# Patient Record
Sex: Female | Born: 1980 | Race: White | Hispanic: No | Marital: Single | Smoking: Current some day smoker
Health system: Southern US, Community
[De-identification: ages and names within clinical notes are randomized; demographics above are authoritative.]

## PROBLEM LIST (undated history)

## (undated) DIAGNOSIS — M545 Low back pain, unspecified: Secondary | ICD-10-CM

## (undated) DIAGNOSIS — G40409 Other generalized epilepsy and epileptic syndromes, not intractable, without status epilepticus: Secondary | ICD-10-CM

## (undated) DIAGNOSIS — F32A Depression, unspecified: Secondary | ICD-10-CM

## (undated) DIAGNOSIS — R51 Headache: Secondary | ICD-10-CM

## (undated) DIAGNOSIS — G8929 Other chronic pain: Secondary | ICD-10-CM

## (undated) DIAGNOSIS — F419 Anxiety disorder, unspecified: Secondary | ICD-10-CM

## (undated) DIAGNOSIS — F329 Major depressive disorder, single episode, unspecified: Secondary | ICD-10-CM

## (undated) DIAGNOSIS — G43909 Migraine, unspecified, not intractable, without status migrainosus: Secondary | ICD-10-CM

## (undated) DIAGNOSIS — R519 Headache, unspecified: Secondary | ICD-10-CM

## (undated) DIAGNOSIS — K219 Gastro-esophageal reflux disease without esophagitis: Secondary | ICD-10-CM

## (undated) DIAGNOSIS — K259 Gastric ulcer, unspecified as acute or chronic, without hemorrhage or perforation: Secondary | ICD-10-CM

---

## 2001-08-19 ENCOUNTER — Emergency Department (HOSPITAL_COMMUNITY): Admission: EM | Admit: 2001-08-19 | Discharge: 2001-08-19 | Payer: Self-pay | Admitting: Emergency Medicine

## 2003-06-20 ENCOUNTER — Emergency Department (HOSPITAL_COMMUNITY): Admission: EM | Admit: 2003-06-20 | Discharge: 2003-06-21 | Payer: Self-pay | Admitting: Emergency Medicine

## 2003-06-21 ENCOUNTER — Encounter: Payer: Self-pay | Admitting: Emergency Medicine

## 2003-10-08 ENCOUNTER — Ambulatory Visit (HOSPITAL_COMMUNITY): Admission: RE | Admit: 2003-10-08 | Discharge: 2003-10-08 | Payer: Self-pay | Admitting: Family Medicine

## 2003-10-18 HISTORY — PX: LAPAROSCOPIC CHOLECYSTECTOMY: SUR755

## 2003-11-11 ENCOUNTER — Other Ambulatory Visit: Admission: RE | Admit: 2003-11-11 | Discharge: 2003-11-11 | Payer: Self-pay | Admitting: Family Medicine

## 2003-11-11 ENCOUNTER — Other Ambulatory Visit: Admission: RE | Admit: 2003-11-11 | Discharge: 2003-11-11 | Payer: Self-pay | Admitting: *Deleted

## 2003-12-17 ENCOUNTER — Ambulatory Visit (HOSPITAL_COMMUNITY): Admission: RE | Admit: 2003-12-17 | Discharge: 2003-12-17 | Payer: Self-pay | Admitting: Family Medicine

## 2004-09-26 ENCOUNTER — Inpatient Hospital Stay (HOSPITAL_COMMUNITY): Admission: EM | Admit: 2004-09-26 | Discharge: 2004-09-28 | Payer: Self-pay | Admitting: Emergency Medicine

## 2004-09-27 ENCOUNTER — Encounter (INDEPENDENT_AMBULATORY_CARE_PROVIDER_SITE_OTHER): Payer: Self-pay | Admitting: *Deleted

## 2005-01-07 ENCOUNTER — Encounter: Admission: RE | Admit: 2005-01-07 | Discharge: 2005-01-07 | Payer: Self-pay | Admitting: General Surgery

## 2005-06-14 ENCOUNTER — Encounter: Admission: RE | Admit: 2005-06-14 | Discharge: 2005-06-14 | Payer: Self-pay | Admitting: General Surgery

## 2005-06-27 ENCOUNTER — Emergency Department (HOSPITAL_COMMUNITY): Admission: EM | Admit: 2005-06-27 | Discharge: 2005-06-27 | Payer: Self-pay | Admitting: Emergency Medicine

## 2005-07-14 ENCOUNTER — Other Ambulatory Visit: Admission: RE | Admit: 2005-07-14 | Discharge: 2005-07-14 | Payer: Self-pay | Admitting: Family Medicine

## 2005-08-10 ENCOUNTER — Ambulatory Visit (HOSPITAL_COMMUNITY): Admission: RE | Admit: 2005-08-10 | Discharge: 2005-08-10 | Payer: Self-pay | Admitting: Gastroenterology

## 2005-12-03 ENCOUNTER — Emergency Department (HOSPITAL_COMMUNITY): Admission: EM | Admit: 2005-12-03 | Discharge: 2005-12-03 | Payer: Self-pay | Admitting: Emergency Medicine

## 2006-01-07 ENCOUNTER — Emergency Department (HOSPITAL_COMMUNITY): Admission: EM | Admit: 2006-01-07 | Discharge: 2006-01-08 | Payer: Self-pay | Admitting: Emergency Medicine

## 2006-01-07 ENCOUNTER — Emergency Department (HOSPITAL_COMMUNITY): Admission: EM | Admit: 2006-01-07 | Discharge: 2006-01-07 | Payer: Self-pay | Admitting: Emergency Medicine

## 2006-01-11 ENCOUNTER — Ambulatory Visit (HOSPITAL_BASED_OUTPATIENT_CLINIC_OR_DEPARTMENT_OTHER): Admission: RE | Admit: 2006-01-11 | Discharge: 2006-01-12 | Payer: Self-pay | Admitting: *Deleted

## 2006-03-01 ENCOUNTER — Emergency Department (HOSPITAL_COMMUNITY): Admission: EM | Admit: 2006-03-01 | Discharge: 2006-03-01 | Payer: Self-pay | Admitting: Emergency Medicine

## 2006-06-08 ENCOUNTER — Ambulatory Visit (HOSPITAL_COMMUNITY): Admission: RE | Admit: 2006-06-08 | Discharge: 2006-06-08 | Payer: Self-pay | Admitting: Gastroenterology

## 2006-06-21 ENCOUNTER — Emergency Department (HOSPITAL_COMMUNITY): Admission: EM | Admit: 2006-06-21 | Discharge: 2006-06-21 | Payer: Self-pay | Admitting: Emergency Medicine

## 2006-09-12 ENCOUNTER — Emergency Department (HOSPITAL_COMMUNITY): Admission: EM | Admit: 2006-09-12 | Discharge: 2006-09-12 | Payer: Self-pay | Admitting: Emergency Medicine

## 2006-09-14 ENCOUNTER — Emergency Department (HOSPITAL_COMMUNITY): Admission: EM | Admit: 2006-09-14 | Discharge: 2006-09-15 | Payer: Self-pay | Admitting: Emergency Medicine

## 2007-02-14 ENCOUNTER — Emergency Department (HOSPITAL_COMMUNITY): Admission: EM | Admit: 2007-02-14 | Discharge: 2007-02-14 | Payer: Self-pay | Admitting: Emergency Medicine

## 2007-03-29 ENCOUNTER — Emergency Department: Payer: Self-pay | Admitting: Emergency Medicine

## 2007-06-19 ENCOUNTER — Emergency Department: Payer: Self-pay | Admitting: Emergency Medicine

## 2007-07-28 ENCOUNTER — Emergency Department (HOSPITAL_COMMUNITY): Admission: EM | Admit: 2007-07-28 | Discharge: 2007-07-28 | Payer: Self-pay | Admitting: Emergency Medicine

## 2007-10-14 ENCOUNTER — Emergency Department (HOSPITAL_COMMUNITY): Admission: EM | Admit: 2007-10-14 | Discharge: 2007-10-14 | Payer: Self-pay | Admitting: Emergency Medicine

## 2007-10-18 HISTORY — PX: LACERATION REPAIR: SHX5168

## 2008-08-26 ENCOUNTER — Emergency Department (HOSPITAL_COMMUNITY): Admission: EM | Admit: 2008-08-26 | Discharge: 2008-08-26 | Payer: Self-pay | Admitting: Emergency Medicine

## 2008-09-30 ENCOUNTER — Emergency Department (HOSPITAL_COMMUNITY): Admission: EM | Admit: 2008-09-30 | Discharge: 2008-09-30 | Payer: Self-pay | Admitting: Emergency Medicine

## 2009-10-17 HISTORY — PX: TUBAL LIGATION: SHX77

## 2009-12-22 ENCOUNTER — Inpatient Hospital Stay (HOSPITAL_COMMUNITY): Admission: RE | Admit: 2009-12-22 | Discharge: 2009-12-25 | Payer: Self-pay | Admitting: Obstetrics and Gynecology

## 2009-12-22 ENCOUNTER — Encounter (INDEPENDENT_AMBULATORY_CARE_PROVIDER_SITE_OTHER): Payer: Self-pay | Admitting: Obstetrics and Gynecology

## 2010-11-06 ENCOUNTER — Encounter: Payer: Self-pay | Admitting: Gastroenterology

## 2010-11-07 ENCOUNTER — Encounter: Payer: Self-pay | Admitting: Gastroenterology

## 2010-11-07 ENCOUNTER — Encounter: Payer: Self-pay | Admitting: General Surgery

## 2011-01-10 LAB — CBC
HCT: 25.2 % — ABNORMAL LOW (ref 36.0–46.0)
HCT: 27.9 % — ABNORMAL LOW (ref 36.0–46.0)
Hemoglobin: 8 g/dL — ABNORMAL LOW (ref 12.0–15.0)
Hemoglobin: 8.8 g/dL — ABNORMAL LOW (ref 12.0–15.0)
MCHC: 31.5 g/dL (ref 30.0–36.0)
MCHC: 31.8 g/dL (ref 30.0–36.0)
MCV: 72.9 fL — ABNORMAL LOW (ref 78.0–100.0)
MCV: 73.2 fL — ABNORMAL LOW (ref 78.0–100.0)
Platelets: 348 10*3/uL (ref 150–400)
Platelets: 422 10*3/uL — ABNORMAL HIGH (ref 150–400)
RBC: 3.46 MIL/uL — ABNORMAL LOW (ref 3.87–5.11)
RBC: 3.81 MIL/uL — ABNORMAL LOW (ref 3.87–5.11)
RDW: 19.2 % — ABNORMAL HIGH (ref 11.5–15.5)
RDW: 19.3 % — ABNORMAL HIGH (ref 11.5–15.5)
WBC: 13.7 10*3/uL — ABNORMAL HIGH (ref 4.0–10.5)
WBC: 15.3 10*3/uL — ABNORMAL HIGH (ref 4.0–10.5)

## 2011-01-10 LAB — RAPID URINE DRUG SCREEN, HOSP PERFORMED
Amphetamines: NOT DETECTED
Barbiturates: NOT DETECTED
Benzodiazepines: POSITIVE — AB
Cocaine: POSITIVE — AB
Opiates: POSITIVE — AB
Tetrahydrocannabinol: NOT DETECTED

## 2011-01-10 LAB — RH IMMUNE GLOB WKUP(>/=20WKS)(NOT WOMEN'S HOSP): Fetal Screen: NEGATIVE

## 2011-01-10 LAB — RPR: RPR Ser Ql: NONREACTIVE

## 2011-03-04 NOTE — Op Note (Signed)
[name], [name]              ACCOUNT NO.:  [id number]   MEDICAL RECORD NO.:  [id number]          PATIENT TYPE:  INP   LOCATION:  5738                         FACILITY:  [facility]   PHYSICIAN:  Gabrielle Dare. Janee Morn, M.D.DATE OF BIRTH:  06-20-1981   DATE OF PROCEDURE:  09/27/2004  DATE OF DISCHARGE:                                 OPERATIVE REPORT   PREOPERATIVE DIAGNOSIS:  Acute cholecystitis   POSTOPERATIVE DIAGNOSIS:  Acute cholecystitis   OPERATION/PROCEDURE:  Laparoscopic cholecystectomy with intraoperative  cholangiogram.   SURGEON:  Gabrielle Dare. Janee Morn, M.D.   ASSISTANT:  Johny Shears, R.N.F.A.   ANESTHESIA:  General.   HISTORY OF PRESENT ILLNESS:  The patient is a 30 year old white who is four  months postpartum and has had episodic right upper quadrant pain since the  end of her pregnancy.  Work-up in the emergency department was consistent  with acute cholecystitis.  Liver function tests were normal.  She was  brought to the operating room for cholecystectomy.   DESCRIPTION OF PROCEDURE:  Informed consent had been obtained.  The patient  is receiving intravenous antibiotics.  She was taken to the operating room  and general anesthesia was administered.  Her abdomen was prepped and draped  in the sterile fashion.   An infraumbilical incision was made.  Subcutaneous tissues were dissected  down revealing the anterior fascia which was divided sharply and the  peritoneal cavity was entered under direct vision without difficulty.  A 0  Vicryl pursestring suture was placed around the fascial opening and the  Hasson trocar was inserted into the abdomen.  The abdomen was insufflated  with carbon dioxide in standard fashion.  Under direct vision an 11 mm  epigastric port and two 5 mm lateral ports were placed.  Marcaine 0.25% with  epinephrine was used at all port sites.  The dome of the gallbladder was  then retracted superiorly and medially.  Several filmy adhesions of  omentum  were dissected off the gallbladder and the duodenum was swept away from the  cystic duct area.  There was evidence of chronic inflammation.  This was  done bluntly without any injury to the duodenum.  Once this was freed up,  the dissection was begun laterally and progressed medially.  The cystic duct  was dissected at the infundibulum and cystic duct junction.  This dissection  continued until a large window was made between the cystic duct, the  infundibulum of the gallbladder and the liver. Once this was accomplished  with excellent visualization, a clip was placed on the infundibulocystic  duct junction.  A small nick was made in the cystic duct and a Reddick  cholangiogram catheter was inserted.  Intraoperative cholangiogram was  obtained which demonstrated no common bile duct filling defects.  The  cholangiogram catheter was removed and three clips were proximally on the  cystic duct.  Further dissection revealed the cystic artery which was  clipped twice proximally and once distally and divided.  The gallbladder was  then further dissected and taken off the liver bed with Bovie cautery.  We  encountered a moderate size  posterior branch of the cystic artery.  This was  clipped twice, once distally and divided.  Further cauterization was used to  remove the gallbladder from the liver bed.  One small lymphatic or vein was  also clipped on the gallbladder side.  The gallbladder was taken off the  liver bed.  The liver bed was cauterized to get excellent hemostasis.  The  gallbladder was placed in an EndoCatch bag and taken out of abdomen via the  infraumbilical port site.  The liver bed was rechecked and excellent  hemostasis was present.  The abdomen was copiously irrigated.  The  irrigation returned clear.  The ports were removed under direct vision.  The  pneumoperitoneum was released.  The Hasson trocar was removed from the  abdomen.  The infraumbilical fascia was closed by  tying the 0 Vicryl  pursestring suture.  All four wounds were copiously irrigated.  Some  additional local anesthetic was injected and the skin of each was closed  with a running 4-0 Vicryl subcuticular stitch.  Sponge, needle and  instrument counts were correct.  Benzoin and Steri-Strips and sterile  dressings were applied.  The patient tolerated the procedure well without  apparent complications and was taken to the recovery room in stable  condition.       BET/MEDQ  D:  09/27/2004  T:  09/27/2004  Job:  [id number]

## 2011-03-04 NOTE — H&P (Signed)
[name], [name]              ACCOUNT NO.:  [id number]   MEDICAL RECORD NO.:  [id number]          PATIENT TYPE:  INP   LOCATION:  5738                         FACILITY:  [facility]   PHYSICIAN:  [name], M.D.DATE OF BIRTH:  04/26/1981   DATE OF ADMISSION:  09/26/2004  DATE OF DISCHARGE:                                HISTORY & PHYSICAL   CHIEF COMPLAINT:  Right upper quadrant abdominal pain.   HISTORY OF PRESENT ILLNESS:  The patient is an otherwise healthy 30 year old  white female who is four months post partum, who presented complaining to  the emergency department of acute onset of right upper quadrant abdominal  pain.  She has had several attacks of this pain and she has seen her primary  physician for evaluation.  He had suspected some gallbladder trouble but she  has yet to have a referral to surgery for evaluation.  This pain remains  localized in the right upper quadrant and evaluation in the emergency  department included laboratory studies which were within normal limits but  an ultrasound of her gallbladder was consistent with acute cholecystitis and  cholelithiasis.  She continues to have some pain and nausea despite pain  medication in the emergency room.   PAST MEDICAL HISTORY:  Ulcer disease as a child when she was 66 years  old.  Otherwise negative.   PAST SURGICAL HISTORY:  None.   CURRENT MEDICATIONS:  Birth control pills.   ALLERGIES:  No known drug allergies.   REVIEW OF SYSTEMS:  Constitutional:  Negative.  Cardiac:  Negative.  Pulmonary:  Negative.  GI:  See history of present illness though she does  note some occasional recent blood and streaking in her stools.  Musculoskeletal:  Negative.  GU:  Negative.   PHYSICAL EXAMINATION:  VITAL SIGNS:  She is afebrile.  GENERAL:  She is awake, alert, in no distress.  HEENT:  Pupils equal and reactive.  Sclerae nonicteric.  NECK:  Supple with no masses.  LUNGS:  Clear to auscultation with normal  respiratory excursion.  HEART:  Regular rate and rhythm.  PMI is palpable on the left chest.  ABDOMEN:  Soft.  She had tenderness in the right upper quadrant with some  voluntary guarding.  No masses or organomegaly are palpated.  RECTAL:  She has no hemorrhoids, no external anal abnormalities, no masses  are noted.  There was no blood.  EXTREMITIES:  No significant edema.   LABORATORY DATA:  Liver function tests are normal.  White blood cell count  is within normal limits.  Ultrasound as described above.   IMPRESSION:  Otherwise healthy female with acute cholecystitis.   PLAN:  Admit to the hospital.  Intravenous fluid hydration, intravenous  antibiotics.  We will plan cholecystectomy with cholangiogram in the  morning. The procedure, risks and benefits of laparoscopic cholecystectomy  were  discussed with the patient and her mother including but not limited to  bleeding, infection, bile duct injury, converting to open procedure and  questions were answered.  We will get her admitted and onto the schedule for  the morning.  BET/MEDQ  D:  09/26/2004  T:  09/27/2004  Job:  [id number]

## 2011-03-04 NOTE — Discharge Summary (Signed)
[name], [name]              ACCOUNT NO.:  [id number]   MEDICAL RECORD NO.:  [id number]          PATIENT TYPE:  INP   LOCATION:  5738                         FACILITY:  [facility]   PHYSICIAN:  Gabrielle Dare. Janee Morn, M.D.DATE OF BIRTH:  [birth year]   DATE OF ADMISSION:  09/26/2004  DATE OF DISCHARGE:  09/28/2004                                 DISCHARGE SUMMARY   DISCHARGE DIAGNOSES:  1.  Acute cholecystitis.  2.  Status post laparoscopic cholecystectomy with intraoperative      cholangiogram.   HISTORY OF PRESENT ILLNESS:  The patient is a 30 year old white female who  is four months postpartum who presented complaining of episodic right upper  quadrant pain since delivery.  Workup was consistent with acute  cholecystitis.  She was admitted and placed on intravenous antibiotics.   HOSPITAL COURSE:  The patient underwent an uncomplicated laparoscopic  cholecystectomy with intraoperative cholangiogram.  Her cholangiogram  demonstrated no common bile duct filling defects.  Postoperatively, she  remained afebrile and hemodynamically stable.  She tolerated gradual  advancement of her diet, and she had good pain control, and she is being  discharged home on postoperative day one in stable condition.   DISCHARGE DIET:  Lowfat.   DISCHARGE ACTIVITY:  No heavy lifting.   DISCHARGE MEDICATIONS:  Percocet 5/325 1-2 q.6 h. as needed for pain.   FOLLOWUP:  With myself in three weeks.       BET/MEDQ  D:  09/28/2004  T:  09/28/2004  Job:  [id number]
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
## 2011-03-04 NOTE — Op Note (Signed)
[name], [name]              ACCOUNT NO.:  [id number]   MEDICAL RECORD NO.:  [id number]          PATIENT TYPE:  AMB   LOCATION:  ENDO                         FACILITY:  [facility]   PHYSICIAN:  Shirley Friar, MDDATE OF BIRTH:  30-Jul-1981   DATE OF PROCEDURE:  08/10/2005  DATE OF DISCHARGE:                                 OPERATIVE REPORT   PROCEDURE:  Upper endoscopy.   INDICATIONS FOR PROCEDURE:  Abdominal pain, vomiting.   HISTORY:  Patient is a 30 year old white female status post laparoscopic  cholecystectomy in December 2005 who was seen in my clinic due to chronic  right upper quadrant abdominal pain that has persisted after surgery.  She  also states that she has intermittent vomiting that has progressed since  seeing me at clinic.  Her intraoperative cholangiogram was negative for  common bile duct filling defects.  She is undergoing this upper endoscopy to  evaluate her abdominal pain and vomiting.  She had negative liver function  tests done recently and has had a negative CAT scan x2 since surgery.   MEDICATIONS:  Demerol 175 mg IV, Versed 17.5 mg, Valium 5 mg.   FINDINGS:  Upper endoscopy scope was inserted into the oropharynx and  advanced through a normal-appearing esophagus down into the stomach.  The  stomach, body and antrum were normal in appearance.  Endoscope was advanced  down into the duodenal bulb which was normal as was the second portion of  the duodenum.  Endoscope was withdrawn back in the stomach and due to  patient's combativeness and agitation, complete retroflexion could not be  done.  The fundus was not completely evaluated and neither was the cardia  due to patient's agitation.  The endoscope was withdrawn and confirmed the  above findings.   ASSESSMENT:  Normal esophagogastroduodenoscopy (incomplete evaluation of  fundus and cardia due to patient's agitation).   PLAN:  1.  Upper GI series as an outpatient.  2.  Start Reglan 10 mg q.a.c.  and nightly secondary to vomiting.  3.  Avoid narcotics and NSAIDs.  4.  Patient needs to establish care with primary care physician and follow      up with them for health maintenance issues.  5.  Follow up in my clinic as needed.      Shirley Friar, MD  Electronically Signed     VCS/MEDQ  D:  08/10/2005  T:  08/10/2005  Job:  [id number]   cc:   Gabrielle Dare. Janee Morn, M.D.  [facility]  [address] [place], [place] [address]

## 2011-07-05 ENCOUNTER — Other Ambulatory Visit: Payer: Self-pay | Admitting: Internal Medicine

## 2011-07-05 ENCOUNTER — Ambulatory Visit
Admission: RE | Admit: 2011-07-05 | Discharge: 2011-07-05 | Disposition: A | Discharge status: Home / Self Care | Payer: Medicaid Other | Source: Ambulatory Visit | Attending: Internal Medicine | Admitting: Internal Medicine

## 2011-07-05 DIAGNOSIS — M79602 Pain in left arm: Secondary | ICD-10-CM

## 2012-02-21 ENCOUNTER — Emergency Department (HOSPITAL_COMMUNITY)
Admission: EM | Admit: 2012-02-21 | Discharge: 2012-02-21 | Disposition: A | Discharge status: Home / Self Care | Payer: Medicaid Other | Attending: Emergency Medicine | Admitting: Emergency Medicine

## 2012-02-21 ENCOUNTER — Encounter (HOSPITAL_COMMUNITY): Payer: Self-pay | Admitting: *Deleted

## 2012-02-21 DIAGNOSIS — K120 Recurrent oral aphthae: Secondary | ICD-10-CM | POA: Insufficient documentation

## 2012-02-21 DIAGNOSIS — F17200 Nicotine dependence, unspecified, uncomplicated: Secondary | ICD-10-CM | POA: Insufficient documentation

## 2012-02-21 MED ORDER — LIDOCAINE VISCOUS 2 % MT SOLN
20.0000 mL | Freq: Once | OROMUCOSAL | Status: AC
  Administered 2012-02-21: 20 mL via OROMUCOSAL
  Filled 2012-02-21: qty 15

## 2012-02-21 NOTE — ED Notes (Signed)
Pt states has pain and inflammation in gums. No s/s of infection noted

## 2012-02-21 NOTE — ED Notes (Signed)
Pt reports she has dentures and top gums are irritated

## 2012-02-21 NOTE — Discharge Instructions (Signed)
Canker Sores  Canker sores are painful, open sores on the inside of the mouth and cheek. They may be white or yellow. The sores usually heal in 1 to 2 weeks. Women are more likely than men to have recurrent canker sores. CAUSES The cause of canker sores is not well understood. More than one cause is likely. Canker sores do not appear to be caused by certain types of germs (viruses or bacteria). Canker sores may be caused by:  An allergic reaction to certain foods.   Digestive problems.   Not having enough vitamin B12, folic acid, and iron.   Female sex hormones. Sores may come only during certain phases of a menstrual cycle. Often, there is improvement during pregnancy.   Genetics. Some people seem to inherit canker sore problems.  Emotional stress and injuries to the mouth may trigger outbreaks, but not cause them.  DIAGNOSIS Canker sores are diagnosed by exam.  TREATMENT  Patients who have frequent bouts of canker sores may have cultures taken of the sores, blood tests, or allergy tests. This helps determine if their sores are caused by a poor diet, an allergy, or some other preventable or treatable disease.   Vitamins may prevent recurrences or reduce the severity of canker sores in people with poor nutrition.   Numbing ointments can relieve pain. These are available in drug stores without a prescription.   Anti-inflammatory steroid mouth rinses or gels may be prescribed by your caregiver for severe sores.   Oral steroids may be prescribed if you have severe, recurrent canker sores. These strong medicines can cause many side effects and should be used only under the close direction of a dentist or physician.   Mouth rinses containing the antibiotic medicine may be prescribed. They may lessen symptoms and speed healing.  Healing usually happens in about 1 or 2 weeks with or without treatment. Certain antibiotic mouth rinses given to pregnant women and young children can permanently  stain teeth. Talk to your caregiver about your treatment. HOME CARE INSTRUCTIONS   Avoid foods that cause canker sores for you.   Avoid citrus juices, spicy or salty foods, and coffee until the sores are healed.   Use a soft-bristled toothbrush.   Chew your food carefully to avoid biting your cheek.   Apply topical numbing medicine to the sore to help relieve pain.   Apply a thin paste of baking soda and water to the sore to help heal the sore.   Only use mouth rinses or medicines for pain or discomfort as directed by your caregiver.  SEEK MEDICAL CARE IF:   Your symptoms are not better in 1 week.   Your sores are still present after 2 weeks.   Your sores are very painful.   You have trouble breathing or swallowing.   Your sores come back frequently.  Document Released: 01/28/2011 Document Revised: 09/22/2011 Document Reviewed: 01/28/2011 [facility] Patient Information 2012 [place], [place].   You may apply the lidocaine given every hour with a qtip to the sore in your mouth.  You should have this rechecked if not improved over the next week.  Please call your dentist or obtain a new dentist here for a recheck.

## 2012-02-22 NOTE — ED Provider Notes (Signed)
History     CSN: [id number]  Arrival date & time 02/21/12  2116   First MD Initiated Contact with Patient 02/21/12 2134      Chief Complaint  Patient presents with  . Dental Problem    (Consider location/radiation/quality/duration/timing/severity/associated sxs/prior treatment) HPI Comments: Patient reports chronically intermittent episodes of a painful ulcer along her upper gingiva at the edge of her denture which has been worse for 1 week.  She reports constant pain,  But worse with attempts at eating and chewing.  Has has symptoms intermittently for the past 3 years since she got her dentures.  She denies fever and chills.  Denies any problems swallowing and she has no facial edema.  The history is provided by the patient.    History reviewed. No pertinent past medical history.  Past Surgical History  Procedure Date  . Cesarean section     No family history on file.  History  Substance Use Topics  . Smoking status: Current Some Day Smoker  . Smokeless tobacco: Not on file  . Alcohol Use: Yes    OB History    Grav Para Term Preterm Abortions TAB SAB Ect Mult Living                  Review of Systems  Constitutional: Negative for fever.  HENT: Positive for dental problem. Negative for sore throat, facial swelling, neck pain and neck stiffness.   Respiratory: Negative for shortness of breath.     Allergies  Review of patient's allergies indicates no known allergies.  Home Medications   Current Outpatient Rx  Name Route Sig Dispense Refill  . KLONOPIN PO Oral Take by mouth.    . TRAMADOL HCL 50 MG PO TABS Oral Take 50 mg by mouth every 6 (six) hours as needed.      BP 119/86  Pulse 112  Temp(Src) 98.1 F (36.7 C) (Oral)  Resp 22  Ht 5\' 2"  (1.575 m)  Wt 140 lb (63.504 kg)  BMI 25.61 kg/m2  SpO2 100%  LMP 02/21/2012  Physical Exam  Constitutional: She is oriented to person, place, and time. She appears well-developed and well-nourished. No distress.    HENT:  Head: Normocephalic and atraumatic.  Right Ear: Tympanic membrane and external ear normal.  Left Ear: Tympanic membrane and external ear normal.  Nose: Nose normal.  Mouth/Throat: Oropharynx is clear and moist and mucous membranes are normal. She has dentures. Oral lesions present.       Deep white based 3 mm ulceration along buccal/gingival mucosal groove above left anterior central incisor,  Which along the edge of the plastic from upper denture plate.   Eyes: Conjunctivae are normal.  Neck: Normal range of motion. Neck supple.  Cardiovascular: Normal rate and normal heart sounds.   Pulmonary/Chest: Effort normal.  Abdominal: She exhibits no distension.  Musculoskeletal: Normal range of motion.  Lymphadenopathy:    She has no cervical adenopathy.  Neurological: She is alert and oriented to person, place, and time.  Skin: Skin is warm and dry. No erythema.  Psychiatric: She has a normal mood and affect.    ED Course  Procedures (including critical care time)  Labs Reviewed - No data to display No results found.   1. Aphthous ulcer       MDM  Pt given lidocaine viscous 2% with instructions to apply with q tip q 1-2 hours prn pain.  F/u with dentist for further management.  No evidence of infection noted on  exam.        Burgess Amor, PA 02/22/12 1400

## 2012-02-22 NOTE — ED Provider Notes (Signed)
Medical screening examination/treatment/procedure(s) were performed by non-physician practitioner and as supervising physician I was immediately available for consultation/collaboration.   Carleene Cooper III, MD 02/22/12 [phone]

## 2012-04-04 ENCOUNTER — Encounter (HOSPITAL_COMMUNITY): Payer: Self-pay | Admitting: Emergency Medicine

## 2012-04-04 ENCOUNTER — Emergency Department (HOSPITAL_COMMUNITY): Payer: Medicaid Other

## 2012-04-04 ENCOUNTER — Emergency Department (HOSPITAL_COMMUNITY)
Admission: EM | Admit: 2012-04-04 | Discharge: 2012-04-04 | Disposition: A | Discharge status: Home / Self Care | Payer: Medicaid Other | Attending: Emergency Medicine | Admitting: Emergency Medicine

## 2012-04-04 DIAGNOSIS — M25559 Pain in unspecified hip: Secondary | ICD-10-CM | POA: Insufficient documentation

## 2012-04-04 DIAGNOSIS — M549 Dorsalgia, unspecified: Secondary | ICD-10-CM | POA: Insufficient documentation

## 2012-04-04 DIAGNOSIS — F17200 Nicotine dependence, unspecified, uncomplicated: Secondary | ICD-10-CM | POA: Insufficient documentation

## 2012-04-04 DIAGNOSIS — R079 Chest pain, unspecified: Secondary | ICD-10-CM | POA: Insufficient documentation

## 2012-04-04 DIAGNOSIS — Y92410 Unspecified street and highway as the place of occurrence of the external cause: Secondary | ICD-10-CM | POA: Insufficient documentation

## 2012-04-04 DIAGNOSIS — M79609 Pain in unspecified limb: Secondary | ICD-10-CM | POA: Insufficient documentation

## 2012-04-04 DIAGNOSIS — T07XXXA Unspecified multiple injuries, initial encounter: Secondary | ICD-10-CM

## 2012-04-04 DIAGNOSIS — M25579 Pain in unspecified ankle and joints of unspecified foot: Secondary | ICD-10-CM | POA: Insufficient documentation

## 2012-04-04 MED ORDER — BACITRACIN 500 UNIT/GM EX OINT
1.0000 "application " | TOPICAL_OINTMENT | Freq: Once | CUTANEOUS | Status: AC
  Administered 2012-04-04: 1 via TOPICAL
  Filled 2012-04-04: qty 0.9

## 2012-04-04 MED ORDER — OXYCODONE-ACETAMINOPHEN 5-325 MG PO TABS: 1.0000 | ORAL_TABLET | Freq: Four times a day (QID) | ORAL | Status: AC | PRN

## 2012-04-04 MED ORDER — HYDROCODONE-ACETAMINOPHEN 5-500 MG PO TABS: 1.0000 | ORAL_TABLET | Freq: Four times a day (QID) | ORAL | Status: DC | PRN

## 2012-04-04 MED ORDER — OXYCODONE-ACETAMINOPHEN 5-325 MG PO TABS
1.0000 | ORAL_TABLET | Freq: Once | ORAL | Status: AC
  Administered 2012-04-04: 1 via ORAL
  Filled 2012-04-04: qty 1
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
## 2012-04-04 NOTE — ED Notes (Signed)
Pt. D/C home. NAD. A.O. X 4.  Ambulatory.
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
## 2012-04-08 ENCOUNTER — Encounter (HOSPITAL_COMMUNITY): Payer: Self-pay | Admitting: Emergency Medicine

## 2012-04-08 ENCOUNTER — Emergency Department (HOSPITAL_COMMUNITY)
Admission: EM | Admit: 2012-04-08 | Discharge: 2012-04-08 | Disposition: A | Discharge status: Home / Self Care | Payer: Medicaid Other | Attending: Emergency Medicine | Admitting: Emergency Medicine

## 2012-04-08 DIAGNOSIS — S39012A Strain of muscle, fascia and tendon of lower back, initial encounter: Secondary | ICD-10-CM

## 2012-04-08 DIAGNOSIS — IMO0002 Reserved for concepts with insufficient information to code with codable children: Secondary | ICD-10-CM | POA: Insufficient documentation

## 2012-04-08 DIAGNOSIS — T07XXXA Unspecified multiple injuries, initial encounter: Secondary | ICD-10-CM | POA: Insufficient documentation

## 2012-04-08 DIAGNOSIS — S161XXA Strain of muscle, fascia and tendon at neck level, initial encounter: Secondary | ICD-10-CM

## 2012-04-08 DIAGNOSIS — S139XXA Sprain of joints and ligaments of unspecified parts of neck, initial encounter: Secondary | ICD-10-CM | POA: Insufficient documentation

## 2012-04-08 DIAGNOSIS — S239XXA Sprain of unspecified parts of thorax, initial encounter: Secondary | ICD-10-CM | POA: Insufficient documentation

## 2012-04-08 DIAGNOSIS — Z888 Allergy status to other drugs, medicaments and biological substances status: Secondary | ICD-10-CM | POA: Insufficient documentation

## 2012-04-08 DIAGNOSIS — F17200 Nicotine dependence, unspecified, uncomplicated: Secondary | ICD-10-CM | POA: Insufficient documentation

## 2012-04-08 DIAGNOSIS — S335XXA Sprain of ligaments of lumbar spine, initial encounter: Secondary | ICD-10-CM | POA: Insufficient documentation

## 2012-04-08 MED ORDER — OXYCODONE-ACETAMINOPHEN 5-325 MG PO TABS
2.0000 | ORAL_TABLET | Freq: Once | ORAL | Status: AC
  Administered 2012-04-08: 2 via ORAL
  Filled 2012-04-08: qty 2

## 2012-04-08 MED ORDER — OXYCODONE-ACETAMINOPHEN 5-325 MG PO TABS: 2.0000 | ORAL_TABLET | Freq: Three times a day (TID) | ORAL | Status: AC | PRN
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
## 2012-04-08 NOTE — Discharge Instructions (Signed)
Abrasions Abrasions are skin scrapes. Their treatment depends on how large and deep the abrasion is. Abrasions do not extend through all layers of the skin. A cut or lesion through all skin layers is called a laceration. HOME CARE INSTRUCTIONS   If you were given a dressing, change it at least once a day or as instructed by your caregiver. If the bandage sticks, soak it off with a solution of water or hydrogen peroxide.   Twice a day, wash the area with soap and water to remove all the cream/ointment. You may do this in a sink, under a tub faucet, or in a shower. Rinse off the soap and pat dry with a clean towel. Look for signs of infection (see below).   Reapply cream/ointment according to your caregiver's instruction. This will help prevent infection and keep the bandage from sticking. Telfa or gauze over the wound and under the dressing or wrap will also help keep the bandage from sticking.   If the bandage becomes wet, dirty, or develops a foul smell, change it as soon as possible.   Only take over-the-counter or prescription medicines for pain, discomfort, or fever as directed by your caregiver.  SEEK IMMEDIATE MEDICAL CARE IF:   Increasing pain in the wound.   Signs of infection develop: redness, swelling, surrounding area is tender to touch, or pus coming from the wound.   You have a fever.   Any foul smell coming from the wound or dressing.  Most skin wounds heal within ten days. Facial wounds heal faster. However, an infection may occur despite proper treatment. You should have the wound checked for signs of infection within 24 to 48 hours or sooner if problems arise. If you were not given a wound-check appointment, look closely at the wound yourself on the second day for early signs of infection listed above. MAKE SURE YOU:   Understand these instructions.   Will watch your condition.   Will get help right away if you are not doing well or get worse.  Document Released:  07/13/2005 Document Revised: 09/22/2011 Document Reviewed: 09/06/2011 [facility] Patient Information 2012 [place], [place].  You have neck pain, possibly from a cervical strain and/or pinched nerve.  SEEK IMMEDIATE MEDICAL ATTENTION IF: You develop difficulties swallowing or breathing.  You have new or worse numbness, weakness, tingling, or movement problems in your arms or legs.  You develop increasing pain which is uncontrolled with medications.  You have change in bowel or bladder function, or other concerns.  SEEK IMMEDIATE MEDICAL ATTENTION IF: New numbness, tingling, weakness, or problem with the use of your arms or legs.  Severe back pain not relieved with medications.  Change in bowel or bladder control.  Increasing pain in any areas of the body (such as chest or abdominal pain).  Shortness of breath, dizziness or fainting.  Nausea (feeling sick to your stomach), vomiting, fever, or sweats.
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
## 2012-05-19 ENCOUNTER — Emergency Department (HOSPITAL_COMMUNITY)
Admission: EM | Admit: 2012-05-19 | Discharge: 2012-05-19 | Disposition: A | Discharge status: Home / Self Care | Payer: No Typology Code available for payment source | Attending: Emergency Medicine | Admitting: Emergency Medicine

## 2012-05-19 ENCOUNTER — Encounter (HOSPITAL_COMMUNITY): Payer: Self-pay

## 2012-05-19 DIAGNOSIS — IMO0002 Reserved for concepts with insufficient information to code with codable children: Secondary | ICD-10-CM | POA: Insufficient documentation

## 2012-05-19 DIAGNOSIS — R059 Cough, unspecified: Secondary | ICD-10-CM | POA: Insufficient documentation

## 2012-05-19 DIAGNOSIS — F17200 Nicotine dependence, unspecified, uncomplicated: Secondary | ICD-10-CM

## 2012-05-19 DIAGNOSIS — L02412 Cutaneous abscess of left axilla: Secondary | ICD-10-CM

## 2012-05-19 DIAGNOSIS — R05 Cough: Secondary | ICD-10-CM

## 2012-05-19 MED ORDER — CEPHALEXIN 500 MG PO CAPS: 500.0000 mg | ORAL_CAPSULE | Freq: Four times a day (QID) | ORAL | Status: AC

## 2012-05-19 MED ORDER — TRAMADOL HCL 50 MG PO TABS
50.0000 mg | ORAL_TABLET | Freq: Four times a day (QID) | ORAL | Status: AC | PRN
Start: 2012-05-19 — End: 2012-05-29
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
## 2012-05-19 NOTE — ED Provider Notes (Signed)
Medical screening examination/treatment/procedure(s) were performed by non-physician practitioner and as supervising physician I was immediately available for consultation/collaboration.   Jermeka Schlotterbeck M Mehlani Blankenburg, MD 05/19/12 [id number] 

## 2012-05-19 NOTE — ED Provider Notes (Signed)
History     CSN: [id number]  Arrival date & time 05/19/12  2009   First MD Initiated Contact with Patient 05/19/12 2126      Chief Complaint  Patient presents with  . Recurrent Skin Infections  . Cough    (Consider location/radiation/quality/duration/timing/severity/associated sxs/prior treatment) HPI  31-year-old female in no acute distress complaining of abscess multiple abscesses to right arm pit over 3 days. Patient had one abscess a week ago opened it and now has multiple. Denies fever.   Patient reports a dry cough for 3 days denies runny nose or any other symptoms. Patient is a current smoker  History reviewed. No pertinent past medical history.  Past Surgical History  Procedure Date  . Cesarean section   . Arm surgery   . Tubal ligation     No family history on file.  History  Substance Use Topics  . Smoking status: Current Some Day Smoker  . Smokeless tobacco: Not on file  . Alcohol Use: Yes    OB History    Grav Para Term Preterm Abortions TAB SAB Ect Mult Living                  Review of Systems  Constitutional: Negative for fever.  Skin: Positive for rash.    Allergies  Codeine  Home Medications   Current Outpatient Rx  Name Route Sig Dispense Refill  . CLONAZEPAM 0.5 MG PO TABS Oral Take 0.5 mg by mouth at bedtime.    . TRAMADOL HCL 50 MG PO TABS Oral Take 50 mg by mouth every 6 (six) hours as needed. Pain      BP 102/81  Pulse 101  Temp 98.6 F (37 C) (Oral)  Resp 18  Ht 5\' 2"  (1.575 m)  Wt 127 lb (57.607 kg)  BMI 23.23 kg/m2  SpO2 100%  LMP 05/14/2012  Physical Exam  Nursing note and vitals reviewed. Constitutional: She is oriented to person, place, and time. She appears well-developed and well-nourished. No distress.  HENT:  Head: Normocephalic.  Eyes: Conjunctivae and EOM are normal.  Cardiovascular: Normal rate.   Pulmonary/Chest: Effort normal and breath sounds normal. No respiratory distress. She has no wheezes. She  has no rales.  Musculoskeletal: Normal range of motion.  Neurological: She is alert and oriented to person, place, and time.  Skin:       Multiple confluent indurated abscesses 2 left axilla. No fluctuance extremely firm to palpation  Psychiatric: She has a normal mood and affect.    ED Course  Procedures (including critical care time)  Labs Reviewed - No data to display No results found.   1. Cough   2. Tobacco use disorder   3. Abscess of axilla, left       MDM  Offered patient incision and drainage but explained to her that it may be necessary to repeat the procedure as they are. Early in her course and nonfluctuant. Patient chose to defer the incision and drainage. I advised her to take her antibiotics and apply warm compresses multiple times a day to speed the maturation of abscesses.  Pt verbalized understanding and agrees with care plan. Outpatient follow-up and return precautions given.           Wynetta Emery, PA-C 05/19/12 2145

## 2012-06-02 ENCOUNTER — Encounter (HOSPITAL_COMMUNITY): Payer: Self-pay | Admitting: *Deleted

## 2012-06-02 ENCOUNTER — Emergency Department (HOSPITAL_COMMUNITY)
Admission: EM | Admit: 2012-06-02 | Discharge: 2012-06-02 | Disposition: A | Discharge status: Home / Self Care | Payer: Self-pay | Attending: Emergency Medicine | Admitting: Emergency Medicine

## 2012-06-02 ENCOUNTER — Emergency Department (HOSPITAL_COMMUNITY): Payer: Self-pay

## 2012-06-02 DIAGNOSIS — R569 Unspecified convulsions: Secondary | ICD-10-CM | POA: Insufficient documentation

## 2012-06-02 DIAGNOSIS — Z885 Allergy status to narcotic agent status: Secondary | ICD-10-CM | POA: Insufficient documentation

## 2012-06-02 DIAGNOSIS — F17200 Nicotine dependence, unspecified, uncomplicated: Secondary | ICD-10-CM | POA: Insufficient documentation

## 2012-06-02 LAB — URINALYSIS, ROUTINE W REFLEX MICROSCOPIC
Bilirubin Urine: NEGATIVE
Glucose, UA: NEGATIVE mg/dL
Hgb urine dipstick: NEGATIVE
Ketones, ur: NEGATIVE mg/dL
Leukocytes, UA: NEGATIVE
Nitrite: NEGATIVE
Protein, ur: NEGATIVE mg/dL
Specific Gravity, Urine: 1.02 (ref 1.005–1.030)
Urobilinogen, UA: 0.2 mg/dL (ref 0.0–1.0)
pH: 6.5 (ref 5.0–8.0)

## 2012-06-02 LAB — RAPID URINE DRUG SCREEN, HOSP PERFORMED
Amphetamines: NOT DETECTED
Barbiturates: NOT DETECTED
Benzodiazepines: NOT DETECTED
Cocaine: NOT DETECTED
Opiates: NOT DETECTED
Tetrahydrocannabinol: NOT DETECTED

## 2012-06-02 LAB — PREGNANCY, URINE: Preg Test, Ur: NEGATIVE

## 2012-06-02 NOTE — ED Notes (Signed)
Lab in to stick and pt stated she wants to go home. In to talk with pt. Comfort measures given. lpt states " i just want to go home, I am fine". Advised of risks/encouraged pt to stay and if she leaves it is important to f/u with pcp or neurologist. Mother at bedside with pt. Pt unstable on feet. EDP aware of AMA.
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
## 2012-06-02 NOTE — ED Notes (Signed)
Patient's boyfriend stated he got up to get dressed and looked back at patient who was in "posturing" position.  By time of EMS arrival, she was alert and oriented.

## 2012-06-22 ENCOUNTER — Emergency Department (HOSPITAL_COMMUNITY)
Admission: EM | Admit: 2012-06-22 | Discharge: 2012-06-22 | Disposition: A | Discharge status: Home / Self Care | Payer: Self-pay | Attending: Emergency Medicine | Admitting: Emergency Medicine

## 2012-06-22 ENCOUNTER — Encounter (HOSPITAL_COMMUNITY): Payer: Self-pay | Admitting: *Deleted

## 2012-06-22 DIAGNOSIS — Y998 Other external cause status: Secondary | ICD-10-CM | POA: Insufficient documentation

## 2012-06-22 DIAGNOSIS — Y9389 Activity, other specified: Secondary | ICD-10-CM | POA: Insufficient documentation

## 2012-06-22 DIAGNOSIS — W268XXA Contact with other sharp object(s), not elsewhere classified, initial encounter: Secondary | ICD-10-CM | POA: Insufficient documentation

## 2012-06-22 DIAGNOSIS — S81809A Unspecified open wound, unspecified lower leg, initial encounter: Secondary | ICD-10-CM | POA: Insufficient documentation

## 2012-06-22 DIAGNOSIS — IMO0002 Reserved for concepts with insufficient information to code with codable children: Secondary | ICD-10-CM

## 2012-06-22 DIAGNOSIS — S81009A Unspecified open wound, unspecified knee, initial encounter: Secondary | ICD-10-CM | POA: Insufficient documentation

## 2012-06-22 MED ORDER — LIDOCAINE HCL (PF) 1 % IJ SOLN
5.0000 mL | Freq: Once | INTRAMUSCULAR | Status: DC
  Filled 2012-06-22: qty 5

## 2012-06-22 MED ORDER — HYDROCODONE-ACETAMINOPHEN 5-325 MG PO TABS: 1.0000 | ORAL_TABLET | ORAL | Status: AC | PRN
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
## 2012-06-22 NOTE — ED Notes (Signed)
Wound was stapled. Dressed.

## 2012-06-22 NOTE — ED Notes (Signed)
Laceration to left lower extremity above ankle - states picked up a trash bag and something poking out of bag cut her leg; approx 3 inch laceration oozing blood; dressing applied.

## 2012-06-22 NOTE — ED Notes (Signed)
Lac to LLE  , alert, talking

## 2012-06-26 NOTE — ED Provider Notes (Signed)
Medical screening examination/treatment/procedure(s) were performed by non-physician practitioner and as supervising physician I was immediately available for consultation/collaboration.   Joya Gaskins, MD 06/26/12 [phone]

## 2012-07-04 ENCOUNTER — Emergency Department (HOSPITAL_COMMUNITY)
Admission: EM | Admit: 2012-07-04 | Discharge: 2012-07-04 | Disposition: A | Discharge status: Home / Self Care | Payer: Self-pay | Attending: Emergency Medicine | Admitting: Emergency Medicine

## 2012-07-04 ENCOUNTER — Encounter (HOSPITAL_COMMUNITY): Payer: Self-pay | Admitting: Emergency Medicine

## 2012-07-04 DIAGNOSIS — F17200 Nicotine dependence, unspecified, uncomplicated: Secondary | ICD-10-CM | POA: Insufficient documentation

## 2012-07-04 DIAGNOSIS — Z4802 Encounter for removal of sutures: Secondary | ICD-10-CM | POA: Insufficient documentation

## 2012-07-04 NOTE — ED Notes (Signed)
EDP removed sutures. Edges approximated, no s/s of infection.

## 2012-07-04 NOTE — ED Provider Notes (Signed)
History     CSN: [id number]  Arrival date & time 07/04/12  1649   First MD Initiated Contact with Patient 07/04/12 1707      Chief Complaint  Patient presents with  . Suture / Staple Removal    (Consider location/radiation/quality/duration/timing/severity/associated sxs/prior treatment) Patient is a 31 y.o. female presenting with suture removal. The history is provided by the patient. No language interpreter was used.  Suture / Staple Removal  Treated in ED: 12 days ago. Treatments since wound repair include regular soap and water washings. Her temperature was unmeasured prior to arrival. There has been no drainage from the wound. The redness has improved. There is no swelling present. The pain has no pain. She has no difficulty moving the affected extremity or digit.    Past Medical History  Diagnosis Date  . Seizures     Past Surgical History  Procedure Date  . Cesarean section   . Arm surgery   . Tubal ligation     No family history on file.  History  Substance Use Topics  . Smoking status: Current Some Day Smoker -- 0.5 packs/day    Types: Cigarettes  . Smokeless tobacco: Not on file  . Alcohol Use: Yes     occassional drinker    OB History    Grav Para Term Preterm Abortions TAB SAB Ect Mult Living   2 2 2              Review of Systems  Constitutional: Negative for fever and chills.  Skin: Positive for wound.  All other systems reviewed and are negative.    Allergies  Review of patient's allergies indicates no known allergies.  Home Medications   Current Outpatient Rx  Name Route Sig Dispense Refill  . CLONAZEPAM 1 MG PO TABS Oral Take 1 mg by mouth 2 (two) times daily as needed. Anxiety.    . IBUPROFEN 200 MG PO TABS Oral Take 400 mg by mouth every 6 (six) hours as needed. Pain.    Marland Kitchen PREGABALIN 75 MG PO CAPS Oral Take 75 mg by mouth as needed. For pain    . TRAMADOL HCL 50 MG PO TABS Oral Take 50 mg by mouth every 6 (six) hours as needed. Pain       BP 132/67  Pulse 81  Temp 98.6 F (37 C) (Oral)  Resp 18  Ht 5\' 2"  (1.575 m)  Wt 140 lb (63.504 kg)  BMI 25.61 kg/m2  SpO2 100%  LMP 06/19/2012  Physical Exam  Nursing note and vitals reviewed. Constitutional: She is oriented to person, place, and time. She appears well-developed and well-nourished. No distress.  HENT:  Head: Normocephalic and atraumatic.  Eyes: EOM are normal.  Neck: Normal range of motion.  Cardiovascular: Normal rate, regular rhythm and normal heart sounds.   Pulmonary/Chest: Effort normal and breath sounds normal.  Abdominal: Soft. She exhibits no distension. There is no tenderness.  Musculoskeletal: Normal range of motion.       Feet:  Neurological: She is alert and oriented to person, place, and time.  Skin: Skin is warm and dry.  Psychiatric: She has a normal mood and affect. Judgment normal.    ED Course  Procedures (including critical care time)  Labs Reviewed - No data to display No results found.   1. Encounter for staple removal       MDM  Return prn         Evalina Field, [place] 07/04/12 1734

## 2012-07-04 NOTE — ED Notes (Signed)
Patient here for staple removal. Six staples intact. Skin appears approximated. Denies s/s of infection.

## 2012-07-05 NOTE — ED Provider Notes (Signed)
Medical screening examination/treatment/procedure(s) were performed by non-physician practitioner and as supervising physician I was immediately available for consultation/collaboration.  Madelon Welsch, MD 07/05/12 1642 

## 2013-05-02 ENCOUNTER — Encounter (HOSPITAL_BASED_OUTPATIENT_CLINIC_OR_DEPARTMENT_OTHER): Payer: Self-pay | Admitting: *Deleted

## 2013-05-02 ENCOUNTER — Emergency Department (HOSPITAL_BASED_OUTPATIENT_CLINIC_OR_DEPARTMENT_OTHER)
Admission: EM | Admit: 2013-05-02 | Discharge: 2013-05-02 | Discharge status: Against Medical Advice | Payer: Self-pay | Attending: Emergency Medicine | Admitting: Emergency Medicine

## 2013-05-02 DIAGNOSIS — K1370 Unspecified lesions of oral mucosa: Secondary | ICD-10-CM | POA: Insufficient documentation

## 2013-05-02 DIAGNOSIS — M549 Dorsalgia, unspecified: Secondary | ICD-10-CM | POA: Insufficient documentation

## 2013-05-02 DIAGNOSIS — G8929 Other chronic pain: Secondary | ICD-10-CM | POA: Insufficient documentation

## 2013-05-02 DIAGNOSIS — F17200 Nicotine dependence, unspecified, uncomplicated: Secondary | ICD-10-CM | POA: Insufficient documentation

## 2013-05-02 DIAGNOSIS — I10 Essential (primary) hypertension: Secondary | ICD-10-CM | POA: Insufficient documentation

## 2013-05-02 MED ORDER — IBUPROFEN 800 MG PO TABS
800.0000 mg | ORAL_TABLET | Freq: Once | ORAL | Status: AC
  Administered 2013-05-02: 800 mg via ORAL
  Filled 2013-05-02: qty 1

## 2013-05-02 NOTE — ED Notes (Signed)
Pt amb to room 3 with quick steady gait in nad.reporting 2 days of back pain, and swelling to the right side of her face where her dentures rub her gums.

## 2013-05-02 NOTE — ED Provider Notes (Signed)
History    CSN: [id number] Arrival date & time 05/02/13  4098  First MD Initiated Contact with Patient 05/02/13 [phone]     Chief Complaint  Patient presents with  . Dental Pain  . Back Pain   (Consider location/radiation/quality/duration/timing/severity/associated sxs/prior Treatment) HPI Comments: 2 days of R paraspinal back pain that started after playing baseball with children.  Hx similar pain in the past. No focal weakness, numbness, tingling, bowel or bladder incontinence, fever, or vomiting.  No abdominal pain or chest pain.  No dysuria or hematuria.  Pain in low back becomes worse when putting pressure on R foot. Nothing makes pain better. Also c/o pain to R upper lip where irritated by dentures. Face was "swollen" yesterday, but now resolved.  No difficulty breathing or swallowing.  The history is provided by the patient.   Past Medical History  Diagnosis Date  . Seizures   . Chronic back pain    Past Surgical History  Procedure Laterality Date  . Cesarean section    . Arm surgery    . Tubal ligation     History reviewed. No pertinent family history. History  Substance Use Topics  . Smoking status: Current Some Day Smoker -- 0.50 packs/day    Types: Cigarettes  . Smokeless tobacco: Not on file  . Alcohol Use: Yes     Comment: occassional drinker   OB History   Grav Para Term Preterm Abortions TAB SAB Ect Mult Living   2 2 2             Review of Systems  Constitutional: Negative for fever, activity change and appetite change.  HENT: Positive for dental problem.   Respiratory: Negative for cough and shortness of breath.   Cardiovascular: Negative for chest pain.  Gastrointestinal: Negative for nausea, vomiting and abdominal pain.  Genitourinary: Negative for hematuria, vaginal bleeding and vaginal discharge.  Musculoskeletal: Positive for back pain.  Skin: Negative for wound.  Neurological: Negative for dizziness, weakness and headaches.  A complete 10  system review of systems was obtained and all systems are negative except as noted in the HPI and PMH.    Allergies  Review of patient's allergies indicates no known allergies.  Home Medications   Current Outpatient Rx  Name  Route  Sig  Dispense  Refill  . clonazePAM (KLONOPIN) 1 MG tablet   Oral   Take 1 mg by mouth 2 (two) times daily as needed. Anxiety.         Marland Kitchen ibuprofen (ADVIL,MOTRIN) 200 MG tablet   Oral   Take 400 mg by mouth every 6 (six) hours as needed. Pain.         . pregabalin (LYRICA) 75 MG capsule   Oral   Take 75 mg by mouth as needed. For pain         . traMADol (ULTRAM) 50 MG tablet   Oral   Take 50 mg by mouth every 6 (six) hours as needed. Pain          BP 122/77  Pulse 93  Temp(Src) 98.3 F (36.8 C) (Oral)  Resp 18  SpO2 99%  LMP 04/30/2013 Physical Exam  Constitutional: She is oriented to person, place, and time. She appears well-developed and well-nourished. No distress.  HENT:  Head: Normocephalic and atraumatic.  Mouth/Throat: Oropharynx is clear and moist. No oropharyngeal exudate.  Slight erythema along inner R upper lip. Dentures removed.  No tenderness along gingiva, no abscess  Eyes: Conjunctivae and EOM are  normal. Pupils are equal, round, and reactive to light.  Neck: Normal range of motion. Neck supple.  Cardiovascular: Normal rate, regular rhythm and normal heart sounds.   No murmur heard. Pulmonary/Chest: Effort normal and breath sounds normal. No respiratory distress.  Abdominal: Soft. There is no tenderness. There is no rebound and no guarding.  Musculoskeletal: Normal range of motion. She exhibits no edema and no tenderness.  R paraspinal lumbar tenderness 5/5 strength in bilateral lower extremities. Ankle plantar and dorsiflexion intact. Great toe extension intact bilaterally. +2 DP and PT pulses. +2 patellar reflexes bilaterally. Normal gait.   Neurological: She is alert and oriented to person, place, and time. No  cranial nerve deficit. She exhibits normal muscle tone. Coordination normal.  Skin: Skin is warm.    ED Course  Procedures (including critical care time) Labs Reviewed  URINALYSIS, ROUTINE W REFLEX MICROSCOPIC  PREGNANCY, URINE   No results found. No diagnosis found.  MDM  Back pain, no neuro deficits or red flags for cauda equina on exam.  No evidence of ludwig's angina. Floor of mouth soft, no abscess.  Patient did not give urine sample and left department stating that she had to go to work. She did not receive discharge instructions.  Glynn Octave, MD 05/02/13 1036

## 2013-08-28 ENCOUNTER — Emergency Department (HOSPITAL_COMMUNITY)
Admission: EM | Admit: 2013-08-28 | Discharge: 2013-08-28 | Disposition: A | Discharge status: Home / Self Care | Payer: Medicaid Other | Attending: Emergency Medicine | Admitting: Emergency Medicine

## 2013-08-28 ENCOUNTER — Encounter (HOSPITAL_COMMUNITY): Payer: Self-pay | Admitting: Emergency Medicine

## 2013-08-28 DIAGNOSIS — M549 Dorsalgia, unspecified: Secondary | ICD-10-CM | POA: Insufficient documentation

## 2013-08-28 DIAGNOSIS — Z79899 Other long term (current) drug therapy: Secondary | ICD-10-CM | POA: Insufficient documentation

## 2013-08-28 DIAGNOSIS — K1370 Unspecified lesions of oral mucosa: Secondary | ICD-10-CM | POA: Insufficient documentation

## 2013-08-28 DIAGNOSIS — G40909 Epilepsy, unspecified, not intractable, without status epilepticus: Secondary | ICD-10-CM | POA: Insufficient documentation

## 2013-08-28 DIAGNOSIS — F17200 Nicotine dependence, unspecified, uncomplicated: Secondary | ICD-10-CM | POA: Insufficient documentation

## 2013-08-28 DIAGNOSIS — R55 Syncope and collapse: Secondary | ICD-10-CM | POA: Insufficient documentation

## 2013-08-28 DIAGNOSIS — G8929 Other chronic pain: Secondary | ICD-10-CM | POA: Insufficient documentation

## 2013-08-28 LAB — CBC
HCT: 30.8 % — ABNORMAL LOW (ref 36.0–46.0)
Hemoglobin: 10.3 g/dL — ABNORMAL LOW (ref 12.0–15.0)
MCH: 28.6 pg (ref 26.0–34.0)
MCHC: 33.4 g/dL (ref 30.0–36.0)
MCV: 85.6 fL (ref 78.0–100.0)
Platelets: 275 10*3/uL (ref 150–400)
RBC: 3.6 MIL/uL — ABNORMAL LOW (ref 3.87–5.11)
RDW: 16.3 % — ABNORMAL HIGH (ref 11.5–15.5)
WBC: 8.7 10*3/uL (ref 4.0–10.5)

## 2013-08-28 LAB — BASIC METABOLIC PANEL
BUN: 9 mg/dL (ref 6–23)
CO2: 24 mEq/L (ref 19–32)
Calcium: 8.6 mg/dL (ref 8.4–10.5)
Chloride: 105 mEq/L (ref 96–112)
Creatinine, Ser: 0.71 mg/dL (ref 0.50–1.10)
GFR calc Af Amer: >90 mL/min (ref >90)
GFR calc non Af Amer: >90 mL/min (ref >90)
Glucose, Bld: 80 mg/dL (ref 70–99)
Potassium: 3.7 mEq/L (ref 3.5–5.1)
Sodium: 138 mEq/L (ref 135–145)

## 2013-08-28 LAB — URINALYSIS, ROUTINE W REFLEX MICROSCOPIC
Bilirubin Urine: NEGATIVE
Glucose, UA: NEGATIVE mg/dL
Ketones, ur: NEGATIVE mg/dL
Nitrite: NEGATIVE
Protein, ur: NEGATIVE mg/dL
Specific Gravity, Urine: 1.007 (ref 1.005–1.030)
Urobilinogen, UA: 0.2 mg/dL (ref 0.0–1.0)
pH: 5 (ref 5.0–8.0)

## 2013-08-28 LAB — GLUCOSE, CAPILLARY: Glucose-Capillary: 92 mg/dL (ref 70–99)

## 2013-08-28 LAB — RAPID URINE DRUG SCREEN, HOSP PERFORMED
Amphetamines: NOT DETECTED
Barbiturates: NOT DETECTED
Benzodiazepines: POSITIVE — AB
Cocaine: NOT DETECTED
Opiates: POSITIVE — AB
Tetrahydrocannabinol: NOT DETECTED

## 2013-08-28 LAB — URINE MICROSCOPIC-ADD ON

## 2013-08-28 LAB — ETHANOL: Alcohol, Ethyl (B): <11 mg/dL (ref 0–11)

## 2013-08-28 MED ORDER — FLUCONAZOLE 200 MG PO TABS: 200.0000 mg | ORAL_TABLET | Freq: Every day | ORAL | Status: DC

## 2013-08-28 MED ORDER — MAGIC MOUTHWASH W/LIDOCAINE: 10.0000 mL | Freq: Four times a day (QID) | ORAL | Status: DC | PRN

## 2013-08-28 NOTE — ED Provider Notes (Signed)
CSN: [id number]     Arrival date & time 08/28/13  1532 History   First MD Initiated Contact with Patient 08/28/13 1849     Chief Complaint  Patient presents with  . Seizures   (Consider location/radiation/quality/duration/timing/severity/associated sxs/prior Treatment) The history is provided by the patient and medical records.   This is a 32 year old female with past medical history significant for chronic back pain and seizures, presenting to the ED for an alleged seizure. Pt states she was helping her brother boxes into his car when she felt very lightheaded and had to sit down.  Pt did not experience LOC.  No twitching or jerking motions witnessed, no head trauma, no oral injury.  Pt states she remembers the entire event which she is usually not able to do after a seizure.  Patient states since her children were taken away by CPS she has had intermittent seizures, last one occurred 4 weeks ago.  Pt states she has never taken seizure medication and has never seen a neurologist because she cannot afford it.  Pt was placed on tramadol and taken off of it because doctor's thought it was causing her to have more seizure, now on oxycodone.  Pt admits that she took xanax PTA which is not her prescription.  Denies any current confusion, disorientation, headaches, tinnitus, dizziness, weakness, nausea, or vomiting.  Pt does have a hx of illicit drug and EtOH abuse.  Patient also complains of gingival blisters and ulcers from her dentures. Patient states she feels that her dentures fit well however she has been getting blisters for several weeks now. Mother states that she will only remove her dentures once or twice a week.  No lip lesions, fevers, sweats, or chills.  Pt does not have a dentist at this time.  Past Medical History  Diagnosis Date  . Seizures   . Chronic back pain    Past Surgical History  Procedure Laterality Date  . Cesarean section    . Arm surgery    . Tubal ligation      History reviewed. No pertinent family history. History  Substance Use Topics  . Smoking status: Current Some Day Smoker -- 0.50 packs/day    Types: Cigarettes  . Smokeless tobacco: Not on file  . Alcohol Use: Yes     Comment: occassional drinker   OB History   Grav Para Term Preterm Abortions TAB SAB Ect Mult Living   2 2 2             Review of Systems  Neurological: Positive for syncope.  All other systems reviewed and are negative.    Allergies  Review of patient's allergies indicates no known allergies.  Home Medications   Current Outpatient Rx  Name  Route  Sig  Dispense  Refill  . clonazePAM (KLONOPIN) 1 MG tablet   Oral   Take 1 mg by mouth 2 (two) times daily as needed. Anxiety.         Marland Kitchen ibuprofen (ADVIL,MOTRIN) 200 MG tablet   Oral   Take 400 mg by mouth every 6 (six) hours as needed. Pain.         . pregabalin (LYRICA) 75 MG capsule   Oral   Take 75 mg by mouth as needed. For pain         . traMADol (ULTRAM) 50 MG tablet   Oral   Take 50 mg by mouth every 6 (six) hours as needed. Pain  BP 118/75  Pulse 103  Temp(Src) 99 F (37.2 C) (Oral)  Resp 20  Wt 145 lb (65.772 kg)  SpO2 98%  Physical Exam  Nursing note and vitals reviewed. Constitutional: She is oriented to person, place, and time. She appears well-developed and well-nourished. No distress.  Appears sleepy and intoxicated  HENT:  Head: Normocephalic and atraumatic.  Mouth/Throat: Oropharynx is clear and moist.  Upper and lower dentures in place; no tongue laceration or injury noted  Eyes: Conjunctivae and EOM are normal. Pupils are equal, round, and reactive to light.  Neck: Normal range of motion.  Cardiovascular: Normal rate, regular rhythm and normal heart sounds.   Pulmonary/Chest: Effort normal and breath sounds normal.  Abdominal: Soft. Bowel sounds are normal.  Musculoskeletal: Normal range of motion.  Neurological: She is alert and oriented to person, place,  and time. She has normal strength. No cranial nerve deficit or sensory deficit. She displays no seizure activity. Gait normal.  CN grossly intact, equal grip strength UE and LE bilaterally; moves all extremities appropriately without ataxia, no focal neuro deficits or facial droop appreciated; non-ataxic gait  Skin: Skin is warm and dry. She is not diaphoretic.  Psychiatric: She has a normal mood and affect.  Speech slurred    ED Course  Procedures (including critical care time)   Labs Review Labs Reviewed  CBC - Abnormal; Notable for the following:    RBC 3.60 (*)    Hemoglobin 10.3 (*)    HCT 30.8 (*)    RDW 16.3 (*)    All other components within normal limits  URINE RAPID DRUG SCREEN (HOSP PERFORMED) - Abnormal; Notable for the following:    Opiates POSITIVE (*)    Benzodiazepines POSITIVE (*)    All other components within normal limits  URINALYSIS, ROUTINE W REFLEX MICROSCOPIC - Abnormal; Notable for the following:    APPearance CLOUDY (*)    Hgb urine dipstick LARGE (*)    Leukocytes, UA MODERATE (*)    All other components within normal limits  URINE MICROSCOPIC-ADD ON - Abnormal; Notable for the following:    Squamous Epithelial / LPF MANY (*)    Bacteria, UA FEW (*)    All other components within normal limits  URINE CULTURE  BASIC METABOLIC PANEL  GLUCOSE, CAPILLARY  ETHANOL   Imaging Review No results found.  EKG Interpretation   None       MDM   1. Near syncope    During triage patient admitted to taking Xanax today, when questioned about this she says she took it last night.  Pt appears under the influence without focal neuro deficits.  Report of events today do not reflect tonic/clonic seizure, more likely near syncope-- CT head deferred.  Will obtain basic labs, UDS, ethanol and reassess.  9:39 PM Labs as above.  UDS + for opiates which she is prescribed and benzos.  Ethanol negative.  U/a with yeast present-- pt denies any urinary sx or vaginal  complaints/irritation at this time.  Pt re-evaluated.  Sleeping in room, NAD.  When awoken, speech is still slurred and pt continues to appear intoxicated-- likely combination of meds taken PTA.  Instructed not to take medications that are not prescribed to her.  FU with [facility].  Rx diflucan if vaginal sx occur.  Pt refused to remove dentures in the ED so i could examine her mouth-- Rx magic mouthwash.  Discussed plan with pt and mom, they agreed.  Return precautions advised.  Misty Stanley  Sherrill Raring, PA-C 08/28/13 2242

## 2013-08-28 NOTE — ED Notes (Signed)
Pt family sts possible seizure today; pt appears impaired at present; pt sts took xanax today; pt sts recent hx of seizures with tramadol use; pt mother sts thinks she may be using other things

## 2013-08-28 NOTE — ED Notes (Signed)
Spoke to PA about plan of care.

## 2013-08-29 NOTE — ED Provider Notes (Signed)
Medical screening examination/treatment/procedure(s) were performed by non-physician practitioner and as supervising physician I was immediately available for consultation/collaboration.  EKG Interpretation   None         Junius Argyle, MD 08/29/13 [phone]

## 2013-08-30 LAB — URINE CULTURE: Culture: NO GROWTH

## 2013-09-02 ENCOUNTER — Emergency Department (HOSPITAL_COMMUNITY)
Admission: EM | Admit: 2013-09-02 | Discharge: 2013-09-02 | Disposition: A | Discharge status: Home / Self Care | Payer: No Typology Code available for payment source | Attending: Emergency Medicine | Admitting: Emergency Medicine

## 2013-09-02 ENCOUNTER — Emergency Department (HOSPITAL_COMMUNITY): Payer: No Typology Code available for payment source

## 2013-09-02 ENCOUNTER — Encounter (HOSPITAL_COMMUNITY): Payer: Self-pay | Admitting: Emergency Medicine

## 2013-09-02 DIAGNOSIS — Y92410 Unspecified street and highway as the place of occurrence of the external cause: Secondary | ICD-10-CM | POA: Insufficient documentation

## 2013-09-02 DIAGNOSIS — Y9389 Activity, other specified: Secondary | ICD-10-CM | POA: Insufficient documentation

## 2013-09-02 DIAGNOSIS — M549 Dorsalgia, unspecified: Secondary | ICD-10-CM

## 2013-09-02 DIAGNOSIS — Z8669 Personal history of other diseases of the nervous system and sense organs: Secondary | ICD-10-CM | POA: Insufficient documentation

## 2013-09-02 DIAGNOSIS — F17200 Nicotine dependence, unspecified, uncomplicated: Secondary | ICD-10-CM | POA: Insufficient documentation

## 2013-09-02 DIAGNOSIS — IMO0002 Reserved for concepts with insufficient information to code with codable children: Secondary | ICD-10-CM | POA: Insufficient documentation

## 2013-09-02 MED ORDER — METHOCARBAMOL 500 MG PO TABS: 500.0000 mg | ORAL_TABLET | Freq: Two times a day (BID) | ORAL | Status: DC | PRN

## 2013-09-02 NOTE — ED Notes (Signed)
Pt up to bathroom without any problems 
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
## 2013-09-02 NOTE — ED Provider Notes (Signed)
Medical screening examination/treatment/procedure(s) were performed by non-physician practitioner and as supervising physician I was immediately available for consultation/collaboration.  EKG Interpretation   None        Gilda Crease, MD 09/02/13 [phone]
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
## 2013-09-02 NOTE — ED Notes (Signed)
Pt to ED on Long Spine Board with c-collar in place.  Pt only c/o pain in lower back.  Pt denies LOC or hitting her head.  Pt alert and oriented x's 3.

## 2013-09-02 NOTE — ED Notes (Signed)
Pt returned from x-ray.  Drink given to pt.   Pt  C/o headache and lower back pain.  PA made aware.

## 2013-12-24 ENCOUNTER — Encounter (HOSPITAL_COMMUNITY): Payer: Self-pay | Admitting: Emergency Medicine

## 2013-12-24 ENCOUNTER — Emergency Department (HOSPITAL_COMMUNITY): Payer: Self-pay

## 2013-12-24 ENCOUNTER — Emergency Department (HOSPITAL_COMMUNITY)
Admission: EM | Admit: 2013-12-24 | Discharge: 2013-12-24 | Disposition: A | Discharge status: Home / Self Care | Payer: Medicaid Other | Attending: Emergency Medicine | Admitting: Emergency Medicine

## 2013-12-24 DIAGNOSIS — R61 Generalized hyperhidrosis: Secondary | ICD-10-CM | POA: Insufficient documentation

## 2013-12-24 DIAGNOSIS — R040 Epistaxis: Secondary | ICD-10-CM | POA: Insufficient documentation

## 2013-12-24 DIAGNOSIS — G479 Sleep disorder, unspecified: Secondary | ICD-10-CM | POA: Insufficient documentation

## 2013-12-24 DIAGNOSIS — M549 Dorsalgia, unspecified: Secondary | ICD-10-CM | POA: Insufficient documentation

## 2013-12-24 DIAGNOSIS — G43909 Migraine, unspecified, not intractable, without status migrainosus: Secondary | ICD-10-CM | POA: Insufficient documentation

## 2013-12-24 DIAGNOSIS — Z79899 Other long term (current) drug therapy: Secondary | ICD-10-CM | POA: Insufficient documentation

## 2013-12-24 DIAGNOSIS — G8929 Other chronic pain: Secondary | ICD-10-CM | POA: Insufficient documentation

## 2013-12-24 DIAGNOSIS — R42 Dizziness and giddiness: Secondary | ICD-10-CM | POA: Insufficient documentation

## 2013-12-24 DIAGNOSIS — Z8669 Personal history of other diseases of the nervous system and sense organs: Secondary | ICD-10-CM | POA: Insufficient documentation

## 2013-12-24 DIAGNOSIS — F17200 Nicotine dependence, unspecified, uncomplicated: Secondary | ICD-10-CM | POA: Insufficient documentation

## 2013-12-24 DIAGNOSIS — K0510 Chronic gingivitis, plaque induced: Secondary | ICD-10-CM | POA: Insufficient documentation

## 2013-12-24 DIAGNOSIS — K0500 Acute gingivitis, plaque induced: Secondary | ICD-10-CM

## 2013-12-24 DIAGNOSIS — F1010 Alcohol abuse, uncomplicated: Secondary | ICD-10-CM | POA: Insufficient documentation

## 2013-12-24 MED ORDER — PENICILLIN V POTASSIUM 500 MG PO TABS: 500.0000 mg | ORAL_TABLET | Freq: Four times a day (QID) | ORAL | Status: DC

## 2013-12-24 MED ORDER — SODIUM CHLORIDE 0.9 % IV BOLUS (SEPSIS): 500.0000 mL | Freq: Once | INTRAVENOUS | Status: DC

## 2013-12-24 NOTE — ED Notes (Signed)
Pt here for migraine that been going on for about a month. Pt states that yesterday she had nose bleed. Pt also has dentures that she hasnt taken out and cleaned so her jaw is swollen. Pt very dizzy and fatigued.

## 2013-12-24 NOTE — ED Notes (Signed)
Initial Contact - pt to [facility] with visitor with c/o HA "for a long time", but worsening x4 days.  Pt also reports a problem with her dentures "it feels swollen in my mouth".  Pt reports feeling generally unwell.  Skin PWD.  Ambulatory with steady gait.  Speaking full/clear sentences.  NAD.

## 2013-12-24 NOTE — Discharge Instructions (Signed)
°  Dental Abscess °A dental abscess is a collection of infected fluid (pus) from a bacterial infection in the inner part of the tooth (pulp). It usually occurs at the end of the tooth's root.  °CAUSES  °· Severe tooth decay. °· Trauma to the tooth that allows bacteria to enter into the pulp, such as a broken or chipped tooth. °SYMPTOMS  °· Severe pain in and around the infected tooth. °· Swelling and redness around the abscessed tooth or in the mouth or face. °· Tenderness. °· Pus drainage. °· Bad breath. °· Bitter taste in the mouth. °· Difficulty swallowing. °· Difficulty opening the mouth. °· Nausea. °· Vomiting. °· Chills. °· Swollen neck glands. °DIAGNOSIS  °· A medical and dental history will be taken. °· An examination will be performed by tapping on the abscessed tooth. °· X-rays may be taken of the tooth to identify the abscess. °TREATMENT °The goal of treatment is to eliminate the infection. You may be prescribed antibiotic medicine to stop the infection from spreading. A root canal may be performed to save the tooth. If the tooth cannot be saved, it may be pulled (extracted) and the abscess may be drained.  °HOME CARE INSTRUCTIONS °· Only take over-the-counter or prescription medicines for pain, fever, or discomfort as directed by your caregiver. °· Rinse your mouth (gargle) often with salt water (¼ tsp salt in 8 oz [250 ml] of warm water) to relieve pain or swelling. °· Do not drive after taking pain medicine (narcotics). °· Do not apply heat to the outside of your face. °· Return to your dentist for further treatment as directed. °SEEK MEDICAL CARE IF: °· Your pain is not helped by medicine. °· Your pain is getting worse instead of better. °SEEK IMMEDIATE MEDICAL CARE IF: °· You have a fever or persistent symptoms for more than 2 3 days. °· You have a fever and your symptoms suddenly get worse. °· You have chills or a very bad headache. °· You have problems breathing or swallowing. °· You have trouble  opening your mouth. °· You have swelling in the neck or around the eye. °Document Released: 10/03/2005 Document Revised: 06/27/2012 Document Reviewed: 01/11/2011 °[employer]® Patient Information ©2014 [employer]. ° ° °

## 2013-12-24 NOTE — ED Provider Notes (Signed)
CSN: [id number]     Arrival date & time 12/24/13  1525 History   First MD Initiated Contact with Patient 12/24/13 1544     Chief Complaint  Patient presents with  . Migraine  . Dizziness  . Fatigue   HPI Patient presents to ED for evaluation of headache.  The pain is located in the bilateral temporal region and eyes.  Onset of symptoms was gradual starting 1 month ago and has been gradually worsening since.  The pain occurs regularly daily. She has experienced photophobia. She reports she wakes up with the pain and goes to bed with pain. Pain is described as throbbing 10/10 pain when it is at its worse.  It is brought on by no particular thing.  It is partially better with ibuprofen and excedrin.  She has taken Excedrin a few hours ago and now it is 5/10. She reports when the pain is the worse she is unable to lift up her head and she has felt disoriented the last 3 days.  The patient also complains of dizziness and speech difficulties over the last 2 days. Boyfriend states she has been intermittently slurring her speach, like she was "drunk." The patient denies decreased physical activity, loss of balance, muscle weakness, numbness of extremities, vision problems and vomiting. The patient has a history of no prior chronic headaches and positive recent stress.  The patient has a history of seizures, which are untreated because she can not afford to go neurology.  The patient denies fever, chills, cough, chest pain, abdominal pain, dysuria, joint pain, rashes. Care prior to arrival consisted of NSAID and excedrin, with minimal relief. Patient does have partial dentures that are rubbing and causing irritation in her mouth. She states that has only been going on for 1 day though and has happened in the past.    Past Medical History  Diagnosis Date  . Seizures   . Chronic back pain    Past Surgical History  Procedure Laterality Date  . Cesarean section    . Arm surgery    . Tubal ligation     No  family history on file. History  Substance Use Topics  . Smoking status: Current Some Day Smoker -- 0.50 packs/day    Types: Cigarettes  . Smokeless tobacco: Not on file  . Alcohol Use: Yes     Comment: occassional drinker   OB History   Grav Para Term Preterm Abortions TAB SAB Ect Mult Living   2 2 2             Review of Systems  Constitutional: Positive for diaphoresis, activity change and fatigue. Negative for fever, chills and appetite change.  HENT: Positive for dental problem, facial swelling and nosebleeds. Negative for congestion, ear pain, rhinorrhea, sinus pressure, sneezing, sore throat and tinnitus.   Eyes: Positive for photophobia. Negative for pain, discharge, redness, itching and visual disturbance.  Respiratory: Negative for cough, chest tightness, shortness of breath and wheezing.   Cardiovascular: Negative for chest pain and palpitations.  Gastrointestinal: Negative for nausea, vomiting, abdominal pain, diarrhea and constipation.  Genitourinary: Negative for dysuria, hematuria, flank pain and pelvic pain.  Musculoskeletal: Negative for neck pain and neck stiffness.  Neurological: Positive for dizziness, speech difficulty and headaches. Negative for tremors, seizures, syncope, facial asymmetry, weakness, light-headedness and numbness.   Allergies  Review of patient's allergies indicates no known allergies.  Home Medications   Current Outpatient Rx  Name  Route  Sig  Dispense  Refill  .  aspirin-acetaminophen-caffeine (EXCEDRIN MIGRAINE) 250-250-65 MG per tablet   Oral   Take 2 tablets by mouth every 6 (six) hours as needed for headache.         . ibuprofen (ADVIL,MOTRIN) 200 MG tablet   Oral   Take 800-1,000 mg by mouth every 6 (six) hours as needed for headache.         . penicillin v potassium (VEETID) 500 MG tablet   Oral   Take 1 tablet (500 mg total) by mouth 4 (four) times daily.   40 tablet   0    BP 128/68  Pulse 106  Temp(Src) 97.8 F  (36.6 C) (Oral)  Resp 16  SpO2 100%  LMP 12/16/2013 Physical Exam Gen: NAD. Sitting in bed with sunglasses on.  HEENT: AT. [place]. Bilateral TM visualized and normal in appearance. Bilateral eyes without injections or icterus. MMM. Bilateral nares . Throat without erythema or exudates. Partial dentures with no signs of drainage or abscess. Mild erythema. No sinus pressure or pain to palpation.  CV: Mildly tachy, reg rhythm  Chest: CTAB, no wheeze or crackles Abd: Soft. NTND. BS positive Ext: No erythema. No edema.  Skin: No rashes, purpura or petechiae.  Neuro: Normal gait. PERLA. EOMi. Alert. CN 2-12 intact. MS 5/5 bilateral UE/LE.  Psych: Normal dress and affect.   ED Course  Procedures (including critical care time) Labs Review Labs Reviewed - No data to display Imaging Review No results found.   EKG Interpretation None      MDM   Final diagnoses:  Gingivitis, acute   Patient presented to ED with complaints of headache. Due to time constraints she is declining imaging of her head, IVF and monitoring. She does have a possible oral infection from dentures. Otherwise neurological exam is completely normal today. Discussed need of full neurological work up need for patient, patient declined. Discussed need for patient to obtain a PCP and get neurological referral for both headaches and seizure disorder. Patient voiced understanding. She was given prescription for penicillin V for possible oral infection.     Ma Hillock, DO 12/24/13 1634

## 2013-12-27 NOTE — ED Provider Notes (Signed)
I saw and evaluated the patient, reviewed the resident's note and I agree with the findings and plan.   EKG Interpretation None      patient with headache x 1 month. Per significant other, has had periods of "confusion" as well. Neurologic exam is normal here. Due to this I recommended CT scan but patient declines as she has to go to work very soon. She's also been having increasing left maxillary pain under her dentures. No abscess or erythema but there is some swelling, and as she takes poor care of her dentures will cover with abx.  Ephraim Hamburger, MD 12/27/13 1136

## 2014-07-29 ENCOUNTER — Encounter (HOSPITAL_BASED_OUTPATIENT_CLINIC_OR_DEPARTMENT_OTHER): Payer: Self-pay | Admitting: Emergency Medicine

## 2014-07-29 ENCOUNTER — Emergency Department (HOSPITAL_BASED_OUTPATIENT_CLINIC_OR_DEPARTMENT_OTHER)
Admission: EM | Admit: 2014-07-29 | Discharge: 2014-07-29 | Discharge status: Against Medical Advice | Payer: No Typology Code available for payment source | Attending: Emergency Medicine | Admitting: Emergency Medicine

## 2014-07-29 DIAGNOSIS — G8929 Other chronic pain: Secondary | ICD-10-CM | POA: Insufficient documentation

## 2014-07-29 DIAGNOSIS — L03113 Cellulitis of right upper limb: Secondary | ICD-10-CM

## 2014-07-29 DIAGNOSIS — Z720 Tobacco use: Secondary | ICD-10-CM | POA: Insufficient documentation

## 2014-07-29 DIAGNOSIS — R000 Tachycardia, unspecified: Secondary | ICD-10-CM | POA: Insufficient documentation

## 2014-07-29 DIAGNOSIS — L02413 Cutaneous abscess of right upper limb: Secondary | ICD-10-CM

## 2014-07-29 DIAGNOSIS — Z791 Long term (current) use of non-steroidal anti-inflammatories (NSAID): Secondary | ICD-10-CM | POA: Insufficient documentation

## 2014-07-29 DIAGNOSIS — Z792 Long term (current) use of antibiotics: Secondary | ICD-10-CM | POA: Insufficient documentation

## 2014-07-29 LAB — CBC
HCT: 32.1 % — ABNORMAL LOW (ref 36.0–46.0)
Hemoglobin: 10.6 g/dL — ABNORMAL LOW (ref 12.0–15.0)
MCH: 29.6 pg (ref 26.0–34.0)
MCHC: 33 g/dL (ref 30.0–36.0)
MCV: 89.7 fL (ref 78.0–100.0)
Platelets: 336 10*3/uL (ref 150–400)
RBC: 3.58 MIL/uL — ABNORMAL LOW (ref 3.87–5.11)
RDW: 16.2 % — ABNORMAL HIGH (ref 11.5–15.5)
WBC: 11.7 10*3/uL — ABNORMAL HIGH (ref 4.0–10.5)

## 2014-07-29 LAB — BASIC METABOLIC PANEL
Anion gap: 13 (ref 5–15)
BUN: 14 mg/dL (ref 6–23)
CO2: 22 mEq/L (ref 19–32)
Calcium: 8.7 mg/dL (ref 8.4–10.5)
Chloride: 107 mEq/L (ref 96–112)
Creatinine, Ser: 0.8 mg/dL (ref 0.50–1.10)
GFR calc Af Amer: >90 mL/min (ref >90)
GFR calc non Af Amer: >90 mL/min (ref >90)
Glucose, Bld: 99 mg/dL (ref 70–99)
Potassium: 3.8 mEq/L (ref 3.7–5.3)
Sodium: 142 mEq/L (ref 137–147)

## 2014-07-29 MED ORDER — SODIUM CHLORIDE 0.9 % IV SOLN
Freq: Once | INTRAVENOUS | Status: AC
  Administered 2014-07-29: 18:00:00 via INTRAVENOUS

## 2014-07-29 MED ORDER — LORAZEPAM 2 MG/ML IJ SOLN
1.0000 mg | Freq: Once | INTRAMUSCULAR | Status: AC
  Administered 2014-07-29: 1 mg via INTRAVENOUS
  Filled 2014-07-29: qty 1

## 2014-07-29 MED ORDER — LIDOCAINE-EPINEPHRINE 2 %-1:100000 IJ SOLN
20.0000 mL | Freq: Once | INTRAMUSCULAR | Status: AC
  Administered 2014-07-29: 10 mL via INTRADERMAL
  Filled 2014-07-29: qty 1

## 2014-07-29 MED ORDER — MORPHINE SULFATE 4 MG/ML IJ SOLN
4.0000 mg | Freq: Once | INTRAMUSCULAR | Status: AC
  Administered 2014-07-29: 4 mg via INTRAVENOUS
  Filled 2014-07-29: qty 1

## 2014-07-29 MED ORDER — CLINDAMYCIN HCL 300 MG PO CAPS: 300.0000 mg | ORAL_CAPSULE | Freq: Four times a day (QID) | ORAL | Status: DC

## 2014-07-29 MED ORDER — VANCOMYCIN HCL IN DEXTROSE 1-5 GM/200ML-% IV SOLN
1000.0000 mg | Freq: Once | INTRAVENOUS | Status: AC
  Administered 2014-07-29: 1000 mg via INTRAVENOUS
  Filled 2014-07-29: qty 200

## 2014-07-29 MED ORDER — HYDROCODONE-ACETAMINOPHEN 5-325 MG PO TABS: 1.0000 | ORAL_TABLET | ORAL | Status: DC | PRN

## 2014-07-29 NOTE — ED Notes (Addendum)
D/c home with ride- pt given rx x 2 for clindamycin and vicodin- discussed wound care and instructed to return to ED for fever, increasing redness, or other concerning symptoms

## 2014-07-29 NOTE — Discharge Instructions (Signed)
Take Vicodin for severe pain only. No driving or operating heavy machinery while taking vicodin. This medication may cause drowsiness. Take clindamycin as directed for the next 7 days. It is very important that you complete the entire course of the antibiotic. Apply warm compresses to the area. If the redness spreads beyond the markings drawn today, immediately return to the emergency department.  Abscess An abscess is an infected area that contains a collection of pus and debris.It can occur in almost any part of the body. An abscess is also known as a furuncle or boil. CAUSES  An abscess occurs when tissue gets infected. This can occur from blockage of oil or sweat glands, infection of hair follicles, or a minor injury to the skin. As the body tries to fight the infection, pus collects in the area and creates pressure under the skin. This pressure causes pain. People with weakened immune systems have difficulty fighting infections and get certain abscesses more often.  SYMPTOMS Usually an abscess develops on the skin and becomes a painful mass that is red, warm, and tender. If the abscess forms under the skin, you may feel a moveable soft area under the skin. Some abscesses break open (rupture) on their own, but most will continue to get worse without care. The infection can spread deeper into the body and eventually into the bloodstream, causing you to feel ill.  DIAGNOSIS  Your caregiver will take your medical history and perform a physical exam. A sample of fluid may also be taken from the abscess to determine what is causing your infection. TREATMENT  Your caregiver may prescribe antibiotic medicines to fight the infection. However, taking antibiotics alone usually does not cure an abscess. Your caregiver may need to make a small cut (incision) in the abscess to drain the pus. In some cases, gauze is packed into the abscess to reduce pain and to continue draining the area. HOME CARE INSTRUCTIONS     Only take over-the-counter or prescription medicines for pain, discomfort, or fever as directed by your caregiver.  If you were prescribed antibiotics, take them as directed. Finish them even if you start to feel better.  If gauze is used, follow your caregiver's directions for changing the gauze.  To avoid spreading the infection:  Keep your draining abscess covered with a bandage.  Wash your hands well.  Do not share personal care items, towels, or whirlpools with others.  Avoid skin contact with others.  Keep your skin and clothes clean around the abscess.  Keep all follow-up appointments as directed by your caregiver. SEEK MEDICAL CARE IF:   You have increased pain, swelling, redness, fluid drainage, or bleeding.  You have muscle aches, chills, or a general ill feeling.  You have a fever. MAKE SURE YOU:   Understand these instructions.  Will watch your condition.  Will get help right away if you are not doing well or get worse. Document Released: 07/13/2005 Document Revised: 04/03/2012 Document Reviewed: 12/16/2011 [facility] Patient Information 2015 [place], [place]. This information is not intended to replace advice given to you by your health care provider. Make sure you discuss any questions you have with your health care provider.  Abscess Care After An abscess (also called a boil or furuncle) is an infected area that contains a collection of pus. Signs and symptoms of an abscess include pain, tenderness, redness, or hardness, or you may feel a moveable soft area under your skin. An abscess can occur anywhere in the body. The infection  may spread to surrounding tissues causing cellulitis. A cut (incision) by the surgeon was made over your abscess and the pus was drained out. Gauze may have been packed into the space to provide a drain that will allow the cavity to heal from the inside outwards. The boil may be painful for 5 to 7 days. Most people with a boil do not  have high fevers. Your abscess, if seen early, may not have localized, and may not have been lanced. If not, another appointment may be required for this if it does not get better on its own or with medications. HOME CARE INSTRUCTIONS   Only take over-the-counter or prescription medicines for pain, discomfort, or fever as directed by your caregiver.  When you bathe, soak and then remove gauze or iodoform packs at least daily or as directed by your caregiver. You may then wash the wound gently with mild soapy water. Repack with gauze or do as your caregiver directs. SEEK IMMEDIATE MEDICAL CARE IF:   You develop increased pain, swelling, redness, drainage, or bleeding in the wound site.  You develop signs of generalized infection including muscle aches, chills, fever, or a general ill feeling.  An oral temperature above 102 F (38.9 C) develops, not controlled by medication. See your caregiver for a recheck if you develop any of the symptoms described above. If medications (antibiotics) were prescribed, take them as directed. Document Released: 04/21/2005 Document Revised: 12/26/2011 Document Reviewed: 12/17/2007 [facility] Patient Information 2015 [place], [place]. This information is not intended to replace advice given to you by your health care provider. Make sure you discuss any questions you have with your health care provider.  Cellulitis Cellulitis is an infection of the skin and the tissue beneath it. The infected area is usually red and tender. Cellulitis occurs most often in the arms and lower legs.  CAUSES  Cellulitis is caused by bacteria that enter the skin through cracks or cuts in the skin. The most common types of bacteria that cause cellulitis are staphylococci and streptococci. SIGNS AND SYMPTOMS   Redness and warmth.  Swelling.  Tenderness or pain.  Fever. DIAGNOSIS  Your health care provider can usually determine what is wrong based on a physical exam. Blood tests may  also be done. TREATMENT  Treatment usually involves taking an antibiotic medicine. HOME CARE INSTRUCTIONS   Take your antibiotic medicine as directed by your health care provider. Finish the antibiotic even if you start to feel better.  Keep the infected arm or leg elevated to reduce swelling.  Apply a warm cloth to the affected area up to 4 times per day to relieve pain.  Take medicines only as directed by your health care provider.  Keep all follow-up visits as directed by your health care provider. SEEK MEDICAL CARE IF:   You notice red streaks coming from the infected area.  Your red area gets larger or turns dark in color.  Your bone or joint underneath the infected area becomes painful after the skin has healed.  Your infection returns in the same area or another area.  You notice a swollen bump in the infected area.  You develop new symptoms.  You have a fever. SEEK IMMEDIATE MEDICAL CARE IF:   You feel very sleepy.  You develop vomiting or diarrhea.  You have a general ill feeling (malaise) with muscle aches and pains. MAKE SURE YOU:   Understand these instructions.  Will watch your condition.  Will get help right away if you  are not doing well or get worse. Document Released: 07/13/2005 Document Revised: 02/17/2014 Document Reviewed: 12/19/2011 [facility] Patient Information 2015 [place], [place]. This information is not intended to replace advice given to you by your health care provider. Make sure you discuss any questions you have with your health care provider.

## 2014-07-29 NOTE — ED Provider Notes (Signed)
CSN: [id number]     Arrival date & time 07/29/14  1634 History   First MD Initiated Contact with Patient 07/29/14 1656     Chief Complaint  Patient presents with  . Abscess     (Consider location/radiation/quality/duration/timing/severity/associated sxs/prior Treatment) HPI Comments: This is a 33 year old female who presents to the emergency department complaining of right forearm abscess, increasing in size over the past 3 days. States it started off as a small red bump and over the past 3 days has significantly increased and become warm. She believes something may have bit her under her sweatshirt. No drainage. 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 Denies fever or chills. States she needs to leave the emergency department soon in order to pick up her daughter, and does not want to have anybody else pickup her daughter.  Patient is a 33 y.o. female presenting with abscess. The history is provided by the patient.  Abscess   Past Medical History  Diagnosis Date  . Seizures   . Chronic back pain    Past Surgical History  Procedure Laterality Date  . Cesarean section    . Arm surgery    . Tubal ligation     No family history on file. History  Substance Use Topics  . Smoking status: Current Some Day Smoker -- 0.50 packs/day    Types: Cigarettes  . Smokeless tobacco: Not on file  . Alcohol Use: Yes     Comment: occassional drinker   OB History   Grav Para Term Preterm Abortions TAB SAB Ect Mult Living   2 2 2             Review of Systems  Skin: Positive for color change.       + Abscess.  All other systems reviewed and are negative.     Allergies  Review of patient's allergies indicates no known allergies.  Home Medications   Prior to Admission medications   Medication Sig Start Date End Date Taking? Authorizing Provider  aspirin-acetaminophen-caffeine (EXCEDRIN MIGRAINE) [phone] MG per tablet  Take 2 tablets by mouth every 6 (six) hours as needed for headache.    Historical Provider, MD  clindamycin (CLEOCIN) 300 MG capsule Take 1 capsule (300 mg total) by mouth 4 (four) times daily. X 7 days 07/29/14   Carman Ching, PA-C  HYDROcodone-acetaminophen (NORCO/VICODIN) 5-325 MG per tablet Take 1-2 tablets by mouth every 4 (four) hours as needed. 07/29/14   Tareka Jhaveri M Love Chowning, PA-C  ibuprofen (ADVIL,MOTRIN) 200 MG tablet Take 800-1,000 mg by mouth every 6 (six) hours as needed for headache.    Historical Provider, MD  penicillin v potassium (VEETID) 500 MG tablet Take 1 tablet (500 mg total) by mouth 4 (four) times daily. 12/24/13   Renee A Kuneff, DO   BP 101/83  Pulse 118  Temp(Src) 98.3 F (36.8 C) (Oral)  Resp 20  Ht 5\' 2"  (1.575 m)  Wt 145 lb (65.772 kg)  BMI 26.51 kg/m2  SpO2 100%  LMP 07/08/2014 Physical Exam  Nursing note and vitals reviewed. Constitutional: She is oriented to person, place, and time. She appears well-developed and well-nourished. No distress.  HENT:  Head: Normocephalic and atraumatic.  Mouth/Throat: Oropharynx is clear and moist.  Eyes: Conjunctivae and EOM are normal.  Neck: Normal range of motion. Neck supple.  Cardiovascular: Regular rhythm and normal heart sounds.   Tachycardic.  Pulmonary/Chest: Effort normal  and breath sounds normal. No respiratory distress.  Musculoskeletal: Normal range of motion.  Large 7 x 15 area of cellulitis on right forearm with central abscess, fluctuance induration, no drainage, streaking to upper arm.  Neurological: She is alert and oriented to person, place, and time. No sensory deficit.  Skin: Skin is warm and dry.  Psychiatric: She has a normal mood and affect. Her behavior is normal.    ED Course  Procedures (including critical care time) INCISION AND DRAINAGE Performed by: Lucien Mons Consent: Verbal consent obtained. Risks and benefits: risks, benefits and alternatives were discussed Type: abscess  Body area:  right forearm  Anesthesia: local infiltration  Incision was made with a scalpel.  Local anesthetic: lidocaine 2% with epinephrine  Anesthetic total: 4 ml  Complexity: complex Blunt dissection to break up loculations  Drainage: purulent  Drainage amount: large  Packing material: none  Patient tolerance: Patient tolerated the procedure well with no immediate complications.    Labs Review Labs Reviewed  CBC - Abnormal; Notable for the following:    WBC 11.7 (*)    RBC 3.58 (*)    Hemoglobin 10.6 (*)    HCT 32.1 (*)    RDW 16.2 (*)    All other components within normal limits  BASIC METABOLIC PANEL    Imaging Review No results found.   EKG Interpretation None      MDM   Final diagnoses:  Abscess of right forearm  Cellulitis of right arm    Patient presenting with abscess and cellulitis of the right arm with streaking, history of IV drug abuse. She is afebrile, tachycardic, vitals otherwise stable. Large amount of purulent drainage from abscess. Leukocytosis of 11.7. Dose of IV vancomycin given in the emergency department. It was suggested patient be admitted for observation given the extent of cellulitis, however patient wanted to sign out [place] so that she could go pick up her daughter. I advised her to return to the emergency department with any worsening symptoms, especially with redness spreading across the skin markings drawn today. Patient states her understanding and agreeable.  Carman Ching, PA-C 07/29/14 [phone]

## 2014-07-29 NOTE — ED Notes (Signed)
Abscess with swelling, redness and pain to her right forearm x 2 days.

## 2014-07-29 NOTE — ED Notes (Signed)
PA at bedside for I & D.

## 2014-07-30 NOTE — ED Provider Notes (Signed)
Medical screening examination/treatment/procedure(s) were performed by non-physician practitioner and as supervising physician I was immediately available for consultation/collaboration.   EKG Interpretation None        Fredia Sorrow, MD 07/30/14 1821

## 2014-08-18 ENCOUNTER — Encounter (HOSPITAL_BASED_OUTPATIENT_CLINIC_OR_DEPARTMENT_OTHER): Payer: Self-pay | Admitting: Emergency Medicine
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
## 2015-03-11 ENCOUNTER — Emergency Department (HOSPITAL_COMMUNITY)
Admission: EM | Admit: 2015-03-11 | Discharge: 2015-03-11 | Disposition: A | Discharge status: Home / Self Care | Payer: Medicaid Other | Attending: Emergency Medicine | Admitting: Emergency Medicine

## 2015-03-11 ENCOUNTER — Encounter (HOSPITAL_COMMUNITY): Payer: Self-pay | Admitting: *Deleted

## 2015-03-11 DIAGNOSIS — G8929 Other chronic pain: Secondary | ICD-10-CM | POA: Insufficient documentation

## 2015-03-11 DIAGNOSIS — L03115 Cellulitis of right lower limb: Secondary | ICD-10-CM

## 2015-03-11 DIAGNOSIS — Z8669 Personal history of other diseases of the nervous system and sense organs: Secondary | ICD-10-CM | POA: Insufficient documentation

## 2015-03-11 DIAGNOSIS — Z720 Tobacco use: Secondary | ICD-10-CM | POA: Insufficient documentation

## 2015-03-11 DIAGNOSIS — Z792 Long term (current) use of antibiotics: Secondary | ICD-10-CM | POA: Insufficient documentation

## 2015-03-11 MED ORDER — CEPHALEXIN 500 MG PO CAPS
1000.0000 mg | ORAL_CAPSULE | Freq: Once | ORAL | Status: AC
  Administered 2015-03-11: 1000 mg via ORAL
  Filled 2015-03-11: qty 2

## 2015-03-11 MED ORDER — CEPHALEXIN 500 MG PO CAPS: 500.0000 mg | ORAL_CAPSULE | Freq: Four times a day (QID) | ORAL | Status: DC

## 2015-03-11 MED ORDER — HYDROCODONE-ACETAMINOPHEN 5-325 MG PO TABS: 1.0000 | ORAL_TABLET | Freq: Four times a day (QID) | ORAL | Status: DC | PRN

## 2015-03-11 NOTE — ED Notes (Signed)
MD at bedside. 

## 2015-03-11 NOTE — ED Notes (Signed)
Pt alert & oriented x4, stable gait. Patient given discharge instructions, paperwork & prescription(s). Patient informed not to drive, operate any equipment & handel any important documents 4 hours after taking pain medication. Patient  instructed to stop at the registration desk to finish any additional paperwork. Patient  verbalized understanding. Pt left department w/ no further questions. 

## 2015-03-11 NOTE — ED Notes (Signed)
   03/11/15 0130  Skin Color/Condition  Skin Color/Condition (WDL) X  Skin Color Appropriate for ethnicity  Skin Integrity Intact  Skin Turgor Non-tenting  pt has noted redness & warmth to the right lower leg. Pt denies injury thinks it started 2 days ago.

## 2015-03-11 NOTE — ED Provider Notes (Signed)
CSN: [id number]     Arrival date & time 03/11/15  0112 History   First MD Initiated Contact with Patient 03/11/15 [phone]     Chief Complaint  Patient presents with  . Leg Swelling     (Consider location/radiation/quality/duration/timing/severity/associated sxs/prior Treatment) HPI  This is a 34 year old female with a two-day history of redness, pain and warmth of her right lateral lower leg. She thinks she may have struck it or have been bitten by an insect. Pain is moderate, worse with movement or palpation. She's not aware of having a fever but has had chills and a headache. She denies nausea, vomiting or diarrhea. There is been no drainage.  Past Medical History  Diagnosis Date  . Seizures   . Chronic back pain    Past Surgical History  Procedure Laterality Date  . Cesarean section    . Arm surgery    . Tubal ligation     History reviewed. No pertinent family history. History  Substance Use Topics  . Smoking status: Current Some Day Smoker -- 0.50 packs/day    Types: Cigarettes  . Smokeless tobacco: Not on file  . Alcohol Use: Yes     Comment: occassional drinker   OB History    Gravida Para Term Preterm AB TAB SAB Ectopic Multiple Living   2 2 2             Review of Systems  All other systems reviewed and are negative.   Allergies  Review of patient's allergies indicates no known allergies.  Home Medications   Prior to Admission medications   Medication Sig Start Date End Date Taking? Authorizing Provider  aspirin-acetaminophen-caffeine (EXCEDRIN MIGRAINE) [phone] MG per tablet Take 2 tablets by mouth every 6 (six) hours as needed for headache.    Historical Provider, MD  clindamycin (CLEOCIN) 300 MG capsule Take 1 capsule (300 mg total) by mouth 4 (four) times daily. X 7 days 07/29/14   Carman Ching, PA-C  HYDROcodone-acetaminophen (NORCO/VICODIN) 5-325 MG per tablet Take 1-2 tablets by mouth every 4 (four) hours as needed. 07/29/14   Robyn M Hess, PA-C    ibuprofen (ADVIL,MOTRIN) 200 MG tablet Take 800-1,000 mg by mouth every 6 (six) hours as needed for headache.    Historical Provider, MD  penicillin v potassium (VEETID) 500 MG tablet Take 1 tablet (500 mg total) by mouth 4 (four) times daily. 12/24/13   Renee A Kuneff, DO   BP 126/61 mmHg  Pulse 110  Temp(Src) 98.6 F (37 C) (Oral)  Resp 18  Wt 165 lb (74.844 kg)  SpO2 99%  LMP 03/11/2015   Physical Exam  General: Well-developed, well-nourished female in no acute distress; appearance consistent with age of record HENT: normocephalic; atraumatic Eyes: pupils equal, round and reactive to light; extraocular muscles intact Neck: supple Heart: regular rate and rhythm Lungs: clear to auscultation bilaterally Abdomen: soft; nondistended; nontender Extremities: No deformity; full range of motion; pulses normal Neurologic: Awake, alert and oriented; motor function intact in all extremities and symmetric; no facial droop Skin: Warm and dry; erythema, warmth and tenderness of right lower leg as shown:   Psychiatric: Normal mood and affect    ED Course  Procedures (including critical care time)    MDM  The patient states she is unable to stay for IV anybody. We'll start her on Keflex and have her return if symptoms worsen. The extent of the erythema was marked with a skin marking pen.  Shanon Rosser, MD 03/11/15 [phone]

## 2015-03-11 NOTE — ED Notes (Signed)
Pt reporting swelling and redness in right lower leg.  Pt states she might have gotten bitten by an insect, but isn't sure.

## 2015-04-01 MED FILL — Hydrocodone-Acetaminophen Tab 5-325 MG: ORAL | Qty: 6 | Status: AC

## 2015-05-11 ENCOUNTER — Emergency Department (HOSPITAL_COMMUNITY)
Admission: EM | Admit: 2015-05-11 | Discharge: 2015-05-11 | Discharge status: Against Medical Advice | Payer: Medicaid Other | Attending: Emergency Medicine | Admitting: Emergency Medicine

## 2015-05-11 ENCOUNTER — Encounter (HOSPITAL_COMMUNITY): Payer: Self-pay | Admitting: Family Medicine

## 2015-05-11 DIAGNOSIS — N151 Renal and perinephric abscess: Secondary | ICD-10-CM

## 2015-05-11 DIAGNOSIS — Z720 Tobacco use: Secondary | ICD-10-CM | POA: Insufficient documentation

## 2015-05-11 DIAGNOSIS — R109 Unspecified abdominal pain: Secondary | ICD-10-CM | POA: Insufficient documentation

## 2015-05-11 DIAGNOSIS — G8929 Other chronic pain: Secondary | ICD-10-CM | POA: Insufficient documentation

## 2015-05-11 NOTE — ED Notes (Signed)
Pt here for back pain radiating into flank area for the past couple of days. sts some vomiting.

## 2015-05-15 ENCOUNTER — Inpatient Hospital Stay (HOSPITAL_COMMUNITY)
Admission: EM | Admit: 2015-05-15 | Discharge: 2015-05-22 | DRG: 690 | Disposition: A | Discharge status: Home / Self Care | Payer: Self-pay | Attending: Family Medicine | Admitting: Family Medicine

## 2015-05-15 ENCOUNTER — Emergency Department (HOSPITAL_COMMUNITY): Payer: Self-pay

## 2015-05-15 ENCOUNTER — Encounter (HOSPITAL_COMMUNITY): Payer: Self-pay

## 2015-05-15 DIAGNOSIS — N151 Renal and perinephric abscess: Principal | ICD-10-CM | POA: Diagnosis present

## 2015-05-15 DIAGNOSIS — F17210 Nicotine dependence, cigarettes, uncomplicated: Secondary | ICD-10-CM | POA: Diagnosis present

## 2015-05-15 DIAGNOSIS — D473 Essential (hemorrhagic) thrombocythemia: Secondary | ICD-10-CM | POA: Diagnosis present

## 2015-05-15 DIAGNOSIS — M549 Dorsalgia, unspecified: Secondary | ICD-10-CM | POA: Diagnosis present

## 2015-05-15 DIAGNOSIS — G8929 Other chronic pain: Secondary | ICD-10-CM | POA: Diagnosis present

## 2015-05-15 DIAGNOSIS — F1111 Opioid abuse, in remission: Secondary | ICD-10-CM | POA: Diagnosis not present

## 2015-05-15 DIAGNOSIS — I959 Hypotension, unspecified: Secondary | ICD-10-CM | POA: Diagnosis present

## 2015-05-15 DIAGNOSIS — R569 Unspecified convulsions: Secondary | ICD-10-CM | POA: Diagnosis present

## 2015-05-15 DIAGNOSIS — Z720 Tobacco use: Secondary | ICD-10-CM | POA: Diagnosis present

## 2015-05-15 DIAGNOSIS — Z23 Encounter for immunization: Secondary | ICD-10-CM

## 2015-05-15 DIAGNOSIS — R935 Abnormal findings on diagnostic imaging of other abdominal regions, including retroperitoneum: Secondary | ICD-10-CM | POA: Diagnosis present

## 2015-05-15 DIAGNOSIS — N12 Tubulo-interstitial nephritis, not specified as acute or chronic: Secondary | ICD-10-CM | POA: Diagnosis present

## 2015-05-15 DIAGNOSIS — D649 Anemia, unspecified: Secondary | ICD-10-CM | POA: Diagnosis present

## 2015-05-15 DIAGNOSIS — D75839 Thrombocytosis, unspecified: Secondary | ICD-10-CM | POA: Diagnosis present

## 2015-05-15 LAB — RAPID URINE DRUG SCREEN, HOSP PERFORMED
Amphetamines: NOT DETECTED
Barbiturates: NOT DETECTED
Benzodiazepines: POSITIVE — AB
Cocaine: POSITIVE — AB
Opiates: POSITIVE — AB
Tetrahydrocannabinol: POSITIVE — AB

## 2015-05-15 LAB — COMPREHENSIVE METABOLIC PANEL
ALT: 23 U/L (ref 14–54)
AST: 29 U/L (ref 15–41)
Albumin: 2.1 g/dL — ABNORMAL LOW (ref 3.5–5.0)
Alkaline Phosphatase: 65 U/L (ref 38–126)
Anion gap: 9 (ref 5–15)
BUN: <5 mg/dL — ABNORMAL LOW (ref 6–20)
CO2: 29 mmol/L (ref 22–32)
Calcium: 8.4 mg/dL — ABNORMAL LOW (ref 8.9–10.3)
Chloride: 101 mmol/L (ref 101–111)
Creatinine, Ser: 0.9 mg/dL (ref 0.44–1.00)
GFR calc Af Amer: >60 mL/min (ref >60)
GFR calc non Af Amer: >60 mL/min (ref >60)
Glucose, Bld: 86 mg/dL (ref 65–99)
Potassium: 3.4 mmol/L — ABNORMAL LOW (ref 3.5–5.1)
Sodium: 139 mmol/L (ref 135–145)
Total Bilirubin: 1.1 mg/dL (ref 0.3–1.2)
Total Protein: 6.6 g/dL (ref 6.5–8.1)

## 2015-05-15 LAB — URINALYSIS, ROUTINE W REFLEX MICROSCOPIC
Glucose, UA: NEGATIVE mg/dL
Hgb urine dipstick: NEGATIVE
Ketones, ur: 15 mg/dL — AB
Nitrite: NEGATIVE
Protein, ur: NEGATIVE mg/dL
Specific Gravity, Urine: 1.021 (ref 1.005–1.030)
Urobilinogen, UA: 1 mg/dL (ref 0.0–1.0)
pH: 5.5 (ref 5.0–8.0)

## 2015-05-15 LAB — CBC WITH DIFFERENTIAL/PLATELET
Basophils Absolute: 0 10*3/uL (ref 0.0–0.1)
Basophils Relative: 0 % (ref 0–1)
Eosinophils Absolute: 0.3 10*3/uL (ref 0.0–0.7)
Eosinophils Relative: 2 % (ref 0–5)
HCT: 33.6 % — ABNORMAL LOW (ref 36.0–46.0)
Hemoglobin: 11.1 g/dL — ABNORMAL LOW (ref 12.0–15.0)
Lymphocytes Relative: 27 % (ref 12–46)
Lymphs Abs: 3.5 10*3/uL (ref 0.7–4.0)
MCH: 29.6 pg (ref 26.0–34.0)
MCHC: 33 g/dL (ref 30.0–36.0)
MCV: 89.6 fL (ref 78.0–100.0)
Monocytes Absolute: 0.6 10*3/uL (ref 0.1–1.0)
Monocytes Relative: 5 % (ref 3–12)
Neutro Abs: 8.7 10*3/uL — ABNORMAL HIGH (ref 1.7–7.7)
Neutrophils Relative %: 66 % (ref 43–77)
Platelets: 790 10*3/uL — ABNORMAL HIGH (ref 150–400)
RBC: 3.75 MIL/uL — ABNORMAL LOW (ref 3.87–5.11)
RDW: 15.1 % (ref 11.5–15.5)
WBC: 13.1 10*3/uL — ABNORMAL HIGH (ref 4.0–10.5)

## 2015-05-15 LAB — POC URINE PREG, ED: Preg Test, Ur: NEGATIVE

## 2015-05-15 LAB — WET PREP, GENITAL
Clue Cells Wet Prep HPF POC: NONE SEEN
Trich, Wet Prep: NONE SEEN
Yeast Wet Prep HPF POC: NONE SEEN

## 2015-05-15 LAB — URINE MICROSCOPIC-ADD ON

## 2015-05-15 MED ORDER — IOHEXOL 300 MG/ML  SOLN
100.0000 mL | Freq: Once | INTRAMUSCULAR | Status: AC | PRN
  Administered 2015-05-15: 100 mL via INTRAVENOUS

## 2015-05-15 MED ORDER — HEPARIN SODIUM (PORCINE) 5000 UNIT/ML IJ SOLN
5000.0000 [IU] | Freq: Three times a day (TID) | INTRAMUSCULAR | Status: DC
  Administered 2015-05-15 – 2015-05-20 (×4): 5000 [IU] via SUBCUTANEOUS
  Filled 2015-05-15 (×7): qty 1

## 2015-05-15 MED ORDER — MORPHINE SULFATE 4 MG/ML IJ SOLN: 4.0000 mg | Freq: Once | INTRAMUSCULAR | Status: DC

## 2015-05-15 MED ORDER — ONDANSETRON HCL 4 MG/2ML IJ SOLN: 4.0000 mg | Freq: Three times a day (TID) | INTRAMUSCULAR | Status: AC | PRN

## 2015-05-15 MED ORDER — SODIUM CHLORIDE 0.9 % IV BOLUS (SEPSIS)
1000.0000 mL | Freq: Once | INTRAVENOUS | Status: AC
  Administered 2015-05-15: 1000 mL via INTRAVENOUS

## 2015-05-15 MED ORDER — POTASSIUM CHLORIDE IN NACL 40-0.9 MEQ/L-% IV SOLN
INTRAVENOUS | Status: AC
  Administered 2015-05-15 – 2015-05-16 (×2): 100 mL/h via INTRAVENOUS
  Filled 2015-05-15 (×3): qty 1000

## 2015-05-15 MED ORDER — ALUM & MAG HYDROXIDE-SIMETH 200-200-20 MG/5ML PO SUSP: 30.0000 mL | Freq: Four times a day (QID) | ORAL | Status: DC | PRN

## 2015-05-15 MED ORDER — ONDANSETRON HCL 4 MG/2ML IJ SOLN: 4.0000 mg | Freq: Four times a day (QID) | INTRAMUSCULAR | Status: DC | PRN

## 2015-05-15 MED ORDER — PNEUMOCOCCAL VAC POLYVALENT 25 MCG/0.5ML IJ INJ
0.5000 mL | INJECTION | INTRAMUSCULAR | Status: DC
  Filled 2015-05-15: qty 0.5

## 2015-05-15 MED ORDER — SODIUM CHLORIDE 0.9 % IV SOLN: INTRAVENOUS | Status: DC

## 2015-05-15 MED ORDER — KETOROLAC TROMETHAMINE 30 MG/ML IJ SOLN
30.0000 mg | Freq: Four times a day (QID) | INTRAMUSCULAR | Status: AC | PRN
  Administered 2015-05-15 – 2015-05-20 (×12): 30 mg via INTRAVENOUS
  Filled 2015-05-15 (×12): qty 1

## 2015-05-15 MED ORDER — ONDANSETRON HCL 4 MG PO TABS: 4.0000 mg | ORAL_TABLET | Freq: Four times a day (QID) | ORAL | Status: DC | PRN

## 2015-05-15 MED ORDER — SODIUM CHLORIDE 0.9 % IV SOLN
Freq: Once | INTRAVENOUS | Status: AC
  Administered 2015-05-15: 14:00:00 via INTRAVENOUS

## 2015-05-15 MED ORDER — CEFTRIAXONE SODIUM IN DEXTROSE 40 MG/ML IV SOLN
2.0000 g | INTRAVENOUS | Status: DC
  Administered 2015-05-16 – 2015-05-22 (×7): 2 g via INTRAVENOUS
  Filled 2015-05-15 (×7): qty 50

## 2015-05-15 MED ORDER — FENTANYL CITRATE (PF) 100 MCG/2ML IJ SOLN
50.0000 ug | Freq: Once | INTRAMUSCULAR | Status: AC
Start: 2015-05-15 — End: 2015-05-15
  Administered 2015-05-15: 50 ug via INTRAVENOUS
  Filled 2015-05-15: qty 2

## 2015-05-15 MED ORDER — ONDANSETRON HCL 4 MG/2ML IJ SOLN
4.0000 mg | Freq: Once | INTRAMUSCULAR | Status: AC
  Administered 2015-05-15: 4 mg via INTRAVENOUS
  Filled 2015-05-15: qty 2

## 2015-05-15 MED ORDER — SENNOSIDES-DOCUSATE SODIUM 8.6-50 MG PO TABS: 1.0000 | ORAL_TABLET | Freq: Every evening | ORAL | Status: DC | PRN

## 2015-05-15 MED ORDER — FENTANYL CITRATE (PF) 100 MCG/2ML IJ SOLN
50.0000 ug | INTRAMUSCULAR | Status: DC | PRN
  Filled 2015-05-15: qty 2

## 2015-05-15 MED ORDER — FENTANYL CITRATE (PF) 100 MCG/2ML IJ SOLN
50.0000 ug | Freq: Once | INTRAMUSCULAR | Status: AC
  Administered 2015-05-15: 50 ug via INTRAVENOUS
  Filled 2015-05-15: qty 2

## 2015-05-15 MED ORDER — FENTANYL CITRATE (PF) 100 MCG/2ML IJ SOLN
100.0000 ug | INTRAMUSCULAR | Status: DC | PRN
  Administered 2015-05-15 – 2015-05-16 (×5): 100 ug via INTRAVENOUS
  Filled 2015-05-15 (×5): qty 2

## 2015-05-15 MED ORDER — CEFTRIAXONE SODIUM IN DEXTROSE 20 MG/ML IV SOLN
1.0000 g | Freq: Once | INTRAVENOUS | Status: DC
  Administered 2015-05-15: 1 g via INTRAVENOUS
  Filled 2015-05-15: qty 50

## 2015-05-15 MED ORDER — DEXTROSE 5 % IV SOLN
1.0000 g | Freq: Once | INTRAVENOUS | Status: AC
  Administered 2015-05-15: 1 g via INTRAVENOUS
  Filled 2015-05-15: qty 10

## 2015-05-15 MED ORDER — SODIUM CHLORIDE 0.9 % IJ SOLN
3.0000 mL | Freq: Two times a day (BID) | INTRAMUSCULAR | Status: DC
  Administered 2015-05-15 – 2015-05-22 (×13): 3 mL via INTRAVENOUS

## 2015-05-15 MED ORDER — CEFTRIAXONE SODIUM IN DEXTROSE 20 MG/ML IV SOLN
1.0000 g | Freq: Once | INTRAVENOUS | Status: DC
  Filled 2015-05-15: qty 50

## 2015-05-15 MED ORDER — HYDROMORPHONE HCL 1 MG/ML IJ SOLN
1.0000 mg | INTRAMUSCULAR | Status: DC | PRN
  Administered 2015-05-15 – 2015-05-16 (×5): 1 mg via INTRAVENOUS
  Filled 2015-05-15 (×5): qty 1

## 2015-05-15 NOTE — ED Provider Notes (Signed)
CSN: [id number]     Arrival date & time 05/15/15  0901 History   First MD Initiated Contact with Patient 05/15/15 0908     No chief complaint on file.    (Consider location/radiation/quality/duration/timing/severity/associated sxs/prior Treatment) HPI [name] is a 34 y.o. female history of seizures and chronic back pain presents to emergency department complaining of abdominal pain. Patient states that she has had pain in the right abdomen for approximately a week. She states pain is worsened with movement, palpation of the abdomen. She states pain radiates to the right back. She denies any urinary symptoms. No vaginal discharge or bleeding. Last menstrual cycle one week ago. She states she has never had pain similar to this in the past. She states "this is the worst pain ever had." She reports pain 10 out of 10. She denies any nausea or vomiting. States she has not had a bowel movement in 1 week. States took aleve prior to coming in  Past Medical History  Diagnosis Date  . Seizures   . Chronic back pain    Past Surgical History  Procedure Laterality Date  . Cesarean section    . Arm surgery    . Tubal ligation     No family history on file. History  Substance Use Topics  . Smoking status: Current Some Day Smoker -- 0.50 packs/day    Types: Cigarettes  . Smokeless tobacco: Not on file  . Alcohol Use: Yes     Comment: occassional drinker   OB History    Gravida Para Term Preterm AB TAB SAB Ectopic Multiple Living   2 2 2             Review of Systems  Constitutional: Negative for fever and chills.  Respiratory: Negative for cough, chest tightness and shortness of breath.   Cardiovascular: Negative for chest pain, palpitations and leg swelling.  Gastrointestinal: Positive for abdominal pain. Negative for nausea, vomiting and diarrhea.  Genitourinary: Negative for dysuria, flank pain, vaginal bleeding, vaginal discharge, vaginal pain and pelvic pain.   Musculoskeletal: Negative for myalgias, arthralgias, neck pain and neck stiffness.  Skin: Negative for rash.  Neurological: Negative for dizziness, weakness and headaches.  All other systems reviewed and are negative.     Allergies  Tylenol  Home Medications   Prior to Admission medications   Medication Sig Start Date End Date Taking? Authorizing Provider  naproxen sodium (ANAPROX) 220 MG tablet Take 220 mg by mouth 2 (two) times daily with a meal.   Yes Historical Provider, MD  cephALEXin (KEFLEX) 500 MG capsule Take 1 capsule (500 mg total) by mouth 4 (four) times daily. Patient not taking: Reported on 05/15/2015 03/11/15   Shanon Rosser, MD  HYDROcodone-acetaminophen (NORCO) 5-325 MG per tablet Take 1-2 tablets by mouth every 6 (six) hours as needed (for pain). Patient not taking: Reported on 05/15/2015 03/11/15   Shanon Rosser, MD  penicillin v potassium (VEETID) 500 MG tablet Take 1 tablet (500 mg total) by mouth 4 (four) times daily. Patient not taking: Reported on 05/15/2015 12/24/13   Renee A Kuneff, DO   BP 108/59 mmHg  Pulse 77  Temp(Src) 97.7 F (36.5 C) (Oral)  Resp 18  Ht 5\' 2"  (1.575 m)  Wt 145 lb (65.772 kg)  BMI 26.51 kg/m2  SpO2 99%  LMP 04/27/2015 Physical Exam  Constitutional: She appears well-developed and well-nourished. No distress.  HENT:  Head: Normocephalic.  Eyes: Conjunctivae are normal.  Neck: Neck supple.  Cardiovascular: Normal rate,  regular rhythm and normal heart sounds.   Pulmonary/Chest: Effort normal and breath sounds normal. No respiratory distress. She has no wheezes. She has no rales.  Abdominal: Soft. Bowel sounds are normal. She exhibits no distension. There is tenderness. There is no rebound.  RLQ tenderness. Right CVA tenderness  Genitourinary:  Normal external genitalia. Normal vaginal canal. Small thin white discharge. Cervix is normal, closed. No CMT. No uterine or adnexal tenderness. No masses palpated.    Musculoskeletal: She  exhibits no edema.  Neurological: She is alert.  Skin: Skin is warm and dry.  Psychiatric: She has a normal mood and affect. Her behavior is normal.  Nursing note and vitals reviewed.   ED Course  Procedures (including critical care time) Labs Review Labs Reviewed  WET PREP, GENITAL - Abnormal; Notable for the following:    WBC, Wet Prep HPF POC FEW (*)    All other components within normal limits  URINALYSIS, ROUTINE W REFLEX MICROSCOPIC (NOT AT [facility] - Abnormal; Notable for the following:    Color, Urine AMBER (*)    APPearance CLOUDY (*)    Bilirubin Urine SMALL (*)    Ketones, ur 15 (*)    Leukocytes, UA MODERATE (*)    All other components within normal limits  CBC WITH DIFFERENTIAL/PLATELET - Abnormal; Notable for the following:    WBC 13.1 (*)    RBC 3.75 (*)    Hemoglobin 11.1 (*)    HCT 33.6 (*)    Platelets 790 (*)    Neutro Abs 8.7 (*)    All other components within normal limits  COMPREHENSIVE METABOLIC PANEL - Abnormal; Notable for the following:    Potassium 3.4 (*)    BUN <5 (*)    Calcium 8.4 (*)    Albumin 2.1 (*)    All other components within normal limits  URINE MICROSCOPIC-ADD ON - Abnormal; Notable for the following:    Squamous Epithelial / LPF FEW (*)    Bacteria, UA FEW (*)    All other components within normal limits  URINE CULTURE  CULTURE, BLOOD (ROUTINE X 2)  CULTURE, BLOOD (SINGLE)  URINE RAPID DRUG SCREEN, HOSP PERFORMED  POC URINE PREG, ED  GC/CHLAMYDIA PROBE AMP ([place]) NOT AT [facility]    Imaging Review Ct Abdomen Pelvis W Contrast  05/15/2015   CLINICAL DATA:  Acute right lower quadrant abdominal pain.  EXAM: CT ABDOMEN AND PELVIS WITH CONTRAST  TECHNIQUE: Multidetector CT imaging of the abdomen and pelvis was performed using the standard protocol following bolus administration of intravenous contrast.  CONTRAST:  167mL OMNIPAQUE IOHEXOL 300 MG/ML  SOLN  COMPARISON:  CT scan of June 14, 2005.  FINDINGS: Mild right posterior  basilar opacity is noted concerning for pneumonia or atelectasis with associated small loculated effusion. 16 mm lucency is noted in the medial portion the right iliac wing which is increased compared to prior exam.  Status post cholecystectomy. The liver, spleen and pancreas appear normal. Adrenal glands and left kidney appear normal. Multiple loculated fluid collections are seen in the right pararenal space consistent with multiple abscesses. Minimal right hydroureteronephrosis is noted without obstructing calculus. Urinary bladder appears normal. There is no evidence of bowel obstruction uterus appears normal. Ovaries appear normal. No significant adenopathy is noted.  IMPRESSION: Minimal right hydroureteronephrosis is noted without obstructing calculus. Multiple small loculated fluid collections are noted around the upper and middle pole of the right kidney most consistent with abscesses secondary to pyelonephritis. Right posterior basilar opacity is  noted as well consistent with atelectasis or possibly pneumonia with small loculated pleural effusion.  16 mm lucency seen superiorly in right iliac wing which is more prominent compared to prior exam. This may simply represent benign abnormality, but further evaluation with bone scan or MRI is recommended on nonemergent basis.   Electronically Signed   By: Marijo Conception, M.D.   On: 05/15/2015 12:41     EKG Interpretation None      MDM   Final diagnoses:  Renal abscess    patient with right lower abdominal pain for a week, worsening. Was seen here 4 days ago at that time temperature 100.5. Today afebrile. Patient is tearful. Will order pain medications, labs, CT abdomen and pelvis.  10:15 AM Patient's blood pressure marginal will order IV fluids. Fentanyl for pain instead of morphine or Dilaudid. Labs and CT pending.  14:20 PM Patient's CT showing hydronephrosis of the right kidney, pyelonephritis, multiple abscesses. I discussed with Dr.  Gaynelle Arabian who will consult on the patient. Will admit patient to medicine. Patient was given Rocephin for infection. CT also showed possible pneumonia, patient denies any cough. Oxygen saturation 100% on room air.  Filed Vitals:   05/15/15 1245 05/15/15 1350 05/15/15 1400 05/15/15 1415  BP: 101/59 100/78 106/68 96/60  Pulse: 74 71 78 75  Temp:      TempSrc:      Resp:  18    Height:      Weight:      SpO2: 96% 99% 100% 100%     ,  Jeannett Senior, PA-C 05/15/15 1526  Tanna Furry, MD 05/22/15 [phone]

## 2015-05-15 NOTE — Progress Notes (Signed)
Utilization review completed. Nguyen Todorov, RN, BSN. 

## 2015-05-15 NOTE — Progress Notes (Signed)
05/15/2015 3:31 PM  Pt admitted to 6E27 via stretcher from the ED.  Pt fully alert and oriented, c/o 10/10 RLQ abdominal/flank pain--prn pain meds administered per order.  Vitals stable.  Skin intact upon assessment.  Full assessment to EPIC.  Pt oriented to room/unit and was instructed on how to utilize the call bell, to which she verbalized understanding.  Pt placed on tele box 27, CCMD notified.  Imogene Burn, [place] notified of patient's arrival.  Pt is a moderate fall risk with a score of 8.  This was reviewed with patient, as well as corresponding fall prevention interventions, to which she verbalized understanding.  Will continue to monitor patient. [name]
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
## 2015-05-15 NOTE — ED Notes (Signed)
Pt. Presents with R lower quadrant abd pain. Intermittent sharp pain. Pt. States pain started in back approx 1 week ago. Pt. Ambulatory. Denies fevers.
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
## 2015-05-15 NOTE — ED Notes (Signed)
Attempted blood cultures. Unsuccessful attempt x 1.

## 2015-05-15 NOTE — ED Notes (Signed)
Patient transported to CT 

## 2015-05-16 ENCOUNTER — Inpatient Hospital Stay (HOSPITAL_COMMUNITY): Payer: Self-pay

## 2015-05-16 DIAGNOSIS — F1310 Sedative, hypnotic or anxiolytic abuse, uncomplicated: Secondary | ICD-10-CM

## 2015-05-16 DIAGNOSIS — I959 Hypotension, unspecified: Secondary | ICD-10-CM

## 2015-05-16 DIAGNOSIS — D473 Essential (hemorrhagic) thrombocythemia: Secondary | ICD-10-CM

## 2015-05-16 DIAGNOSIS — R935 Abnormal findings on diagnostic imaging of other abdominal regions, including retroperitoneum: Secondary | ICD-10-CM

## 2015-05-16 DIAGNOSIS — N151 Renal and perinephric abscess: Secondary | ICD-10-CM | POA: Insufficient documentation

## 2015-05-16 LAB — APTT: aPTT: 33 seconds (ref 24–37)

## 2015-05-16 LAB — URINE CULTURE: Culture: NO GROWTH

## 2015-05-16 LAB — BASIC METABOLIC PANEL
Anion gap: 4 — ABNORMAL LOW (ref 5–15)
BUN: <5 mg/dL — ABNORMAL LOW (ref 6–20)
CO2: 26 mmol/L (ref 22–32)
Calcium: 7.9 mg/dL — ABNORMAL LOW (ref 8.9–10.3)
Chloride: 110 mmol/L (ref 101–111)
Creatinine, Ser: 0.56 mg/dL (ref 0.44–1.00)
GFR calc Af Amer: >60 mL/min (ref >60)
GFR calc non Af Amer: >60 mL/min (ref >60)
Glucose, Bld: 91 mg/dL (ref 65–99)
Potassium: 3.9 mmol/L (ref 3.5–5.1)
Sodium: 140 mmol/L (ref 135–145)

## 2015-05-16 LAB — CBC
HCT: 32 % — ABNORMAL LOW (ref 36.0–46.0)
Hemoglobin: 10.2 g/dL — ABNORMAL LOW (ref 12.0–15.0)
MCH: 28.9 pg (ref 26.0–34.0)
MCHC: 31.9 g/dL (ref 30.0–36.0)
MCV: 90.7 fL (ref 78.0–100.0)
Platelets: 752 10*3/uL — ABNORMAL HIGH (ref 150–400)
RBC: 3.53 MIL/uL — ABNORMAL LOW (ref 3.87–5.11)
RDW: 15.6 % — ABNORMAL HIGH (ref 11.5–15.5)
WBC: 15.1 10*3/uL — ABNORMAL HIGH (ref 4.0–10.5)

## 2015-05-16 LAB — PROTIME-INR
INR: 1.41 (ref 0.00–1.49)
Prothrombin Time: 17.4 seconds — ABNORMAL HIGH (ref 11.6–15.2)

## 2015-05-16 MED ORDER — FENTANYL CITRATE (PF) 100 MCG/2ML IJ SOLN
INTRAMUSCULAR | Status: AC
  Filled 2015-05-16: qty 2

## 2015-05-16 MED ORDER — LIDOCAINE HCL 1 % IJ SOLN
INTRAMUSCULAR | Status: AC
  Filled 2015-05-16: qty 20

## 2015-05-16 MED ORDER — HYDROMORPHONE HCL 1 MG/ML IJ SOLN
1.0000 mg | INTRAMUSCULAR | Status: DC | PRN
  Administered 2015-05-16 – 2015-05-20 (×24): 1 mg via INTRAVENOUS
  Filled 2015-05-16 (×25): qty 1

## 2015-05-16 MED ORDER — FENTANYL CITRATE (PF) 100 MCG/2ML IJ SOLN
INTRAMUSCULAR | Status: AC | PRN
  Administered 2015-05-16: 25 ug via INTRAVENOUS
  Administered 2015-05-16: 50 ug via INTRAVENOUS
  Administered 2015-05-16: 12.5 ug via INTRAVENOUS
  Administered 2015-05-16: 50 ug via INTRAVENOUS
  Administered 2015-05-16: 12.5 ug via INTRAVENOUS

## 2015-05-16 MED ORDER — MIDAZOLAM HCL 2 MG/2ML IJ SOLN
INTRAMUSCULAR | Status: AC
  Filled 2015-05-16: qty 2

## 2015-05-16 MED ORDER — MIDAZOLAM HCL 2 MG/2ML IJ SOLN
INTRAMUSCULAR | Status: AC
  Filled 2015-05-16: qty 4

## 2015-05-16 MED ORDER — MIDAZOLAM HCL 2 MG/2ML IJ SOLN
INTRAMUSCULAR | Status: AC | PRN
  Administered 2015-05-16 (×2): 0.5 mg via INTRAVENOUS
  Administered 2015-05-16 (×2): 1 mg via INTRAVENOUS
  Administered 2015-05-16: 2 mg via INTRAVENOUS

## 2015-05-16 NOTE — Progress Notes (Addendum)
Pt back from radiology. Alert and oriented. Diet resumed, pt complains of pain, RN administered pain meds (see MAR). JP drain dressing intact and dry.
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
## 2015-05-16 NOTE — Sedation Documentation (Signed)
C/o pain titrate meds per order for comfort

## 2015-05-16 NOTE — Consult Note (Signed)
Chief Complaint: Patient was seen in consultation today for CT-guided right renal/perirenal abscess drainage(s) Chief Complaint  Patient presents with  . Abdominal Pain    Referring Physician(s): [facility]/Tannenbaum  History of Present Illness: [name] is a 34 y.o. female with past medical history significant for seizures, tobacco and substance abuse who recently presented to the hospital with 1 week history of right abdominal/flank pain associated with nausea, diaphoresis as well as dark urine. Urine drug screen was positive for THC, cocaine, opiates and benzodiazepine. Recent urine culture was negative. Subsequent CT scan of abdomen pelvis on 7/29 revealed minimal right hydroureteronephrosis without obstructing calculus. There are multiple small loculated fluid collections noted around the upper and midpole of the right kidney most consistent with abscesses. Patient was seen by urology and request is now made for CT-guided drainage of the right renal/perirenal abscess(es). Patient currently denies fever, chest pain, dyspnea or abnormal bleeding.  Past Medical History  Diagnosis Date  . Seizures   . Chronic back pain     Past Surgical History  Procedure Laterality Date  . Cesarean section    . Arm surgery    . Tubal ligation      Allergies: Tylenol  Medications: Prior to Admission medications   Medication Sig Start Date End Date Taking? Authorizing Provider  naproxen sodium (ANAPROX) 220 MG tablet Take 220 mg by mouth 2 (two) times daily with a meal.   Yes Historical Provider, MD     History reviewed. No pertinent family history.  History   Social History  . Marital Status: Single    Spouse Name: N/A  . Number of Children: N/A  . Years of Education: N/A   Social History Main Topics  . Smoking status: Current Some Day Smoker -- 0.50 packs/day    Types: Cigarettes  . Smokeless tobacco: Not on file  . Alcohol Use: Yes     Comment: occassional drinker  .  Drug Use: No  . Sexual Activity: Yes     Comment: tubal ligation   Other Topics Concern  . None   Social History Narrative      Review of Systems  see above  Vital Signs: BP 116/61 mmHg  Pulse 97  Temp(Src) 99.5 F (37.5 C) (Oral)  Resp 17  Ht 5\' 2"  (1.575 m)  Wt 144 lb 2.7 oz (65.394 kg)  BMI 26.36 kg/m2  SpO2 98%  LMP 04/27/2015  Physical Exam patient awake/alert, complaining of right abdominal and flank pain with extension down right lower extremity; chest- diminished breath sounds right base, left clear; abdomen soft, positive bowel sounds, mildly tender right lateral abdomen and flank region; extremities with full range of motion and no significant edema; sensory /motor function intact  Mallampati Score:     Imaging: Ct Abdomen Pelvis W Contrast  05/15/2015   CLINICAL DATA:  Acute right lower quadrant abdominal pain.  EXAM: CT ABDOMEN AND PELVIS WITH CONTRAST  TECHNIQUE: Multidetector CT imaging of the abdomen and pelvis was performed using the standard protocol following bolus administration of intravenous contrast.  CONTRAST:  170mL OMNIPAQUE IOHEXOL 300 MG/ML  SOLN  COMPARISON:  CT scan of June 14, 2005.  FINDINGS: Mild right posterior basilar opacity is noted concerning for pneumonia or atelectasis with associated small loculated effusion. 16 mm lucency is noted in the medial portion the right iliac wing which is increased compared to prior exam.  Status post cholecystectomy. The liver, spleen and pancreas appear normal. Adrenal glands and left kidney  appear normal. Multiple loculated fluid collections are seen in the right pararenal space consistent with multiple abscesses. Minimal right hydroureteronephrosis is noted without obstructing calculus. Urinary bladder appears normal. There is no evidence of bowel obstruction uterus appears normal. Ovaries appear normal. No significant adenopathy is noted.  IMPRESSION: Minimal right hydroureteronephrosis is noted without  obstructing calculus. Multiple small loculated fluid collections are noted around the upper and middle pole of the right kidney most consistent with abscesses secondary to pyelonephritis. Right posterior basilar opacity is noted as well consistent with atelectasis or possibly pneumonia with small loculated pleural effusion.  16 mm lucency seen superiorly in right iliac wing which is more prominent compared to prior exam. This may simply represent benign abnormality, but further evaluation with bone scan or MRI is recommended on nonemergent basis.   Electronically Signed   By: Marijo Conception, M.D.   On: 05/15/2015 12:41    Labs:  CBC:  Recent Labs  07/29/14 1728 05/15/15 0950 05/16/15 0530  WBC 11.7* 13.1* 15.1*  HGB 10.6* 11.1* 10.2*  HCT 32.1* 33.6* 32.0*  PLT 336 790* 752*    COAGS: No results for input(s): INR, APTT in the last 8760 hours.  BMP:  Recent Labs  07/29/14 1728 05/15/15 0950 05/16/15 0530  NA 142 139 140  K 3.8 3.4* 3.9  CL 107 101 110  CO2 22 29 26   GLUCOSE 99 86 91  BUN 14 <5* <5*  CALCIUM 8.7 8.4* 7.9*  CREATININE 0.80 0.90 0.56  GFRNONAA >90 >60 >60  GFRAA >90 >60 >60    LIVER FUNCTION TESTS:  Recent Labs  05/15/15 0950  BILITOT 1.1  AST 29  ALT 23  ALKPHOS 65  PROT 6.6  ALBUMIN 2.1*    TUMOR MARKERS: No results for input(s): AFPTM, CEA, CA199, CHROMGRNA in the last 8760 hours.  Assessment and Plan: 34 year old white female with prior history of tobacco and substance abuse, seizures, recently admitted with one-week history of right abdominal/flank pain, nausea, diaphoresis, dark urine and now with imaging evidence of multiple right renal/perirenal abscesses. Patient's temperature currently 99.5, WBC rising and now is 15.1. Urine culture negative. Imaging studies have been reviewed by Dr. Pascal Lux and plan is for CT-guided drainage of right renal/perirenal abscesses today. Details/risks of procedure, including but not limited to, internal  bleeding, sepsis, injury to adjacent structures, discussed with patient with her understanding and consent.   Thank you for this interesting consult.  I greatly enjoyed meeting [name] and look forward to participating in their care.  A copy of this report was sent to the requesting provider on this date.  Signed: D. Rowe Robert 05/16/2015, 11:06 AM   I spent a total of 40 minutes in face to face in clinical consultation, greater than 50% of which was counseling/coordinating care for CT guided right renal/perirenal abscess drainage

## 2015-05-16 NOTE — Progress Notes (Signed)
Subjective: The patient reports improved since perc abscess drainage.   Objective: Vital signs in last 24 hours: Temp:  [98.1 F (36.7 C)-99.7 F (37.6 C)] 99.7 F (37.6 C) (07/30 1116) Pulse Rate:  [70-97] 79 (07/30 1315) Resp:  [12-24] 14 (07/30 1315) BP: (96-127)/(55-71) 127/55 mmHg (07/30 1315) SpO2:  [97 %-100 %] 100 % (07/30 1315) Weight:  [65.394 kg (144 lb 2.7 oz)] 65.394 kg (144 lb 2.7 oz) (07/29 2022)A  Intake/Output from previous day: 07/29 0701 - 07/30 0700 In: 2638.7 [P.O.:1162; I.V.:1426.7; IV Piggyback:50] Out: -  Intake/Output this shift: Total I/O In: 120 [P.O.:120] Out: -   Past Medical History  Diagnosis Date  . Seizures   . Chronic back pain     Physical Exam:  Lungs - Normal respiratory effort, chest expands symmetrically.  Abdomen - Soft, non-tender & non-distended.  Lab Results:  Recent Labs  05/15/15 0950 05/16/15 0530  WBC 13.1* 15.1*  HGB 11.1* 10.2*  HCT 33.6* 32.0*   BMET  Recent Labs  05/15/15 0950 05/16/15 0530  NA 139 140  K 3.4* 3.9  CL 101 110  CO2 29 26  GLUCOSE 86 91  BUN <5* <5*  CREATININE 0.90 0.56  CALCIUM 8.4* 7.9*   No results for input(s): LABURIN in the last 72 hours. Results for orders placed or performed during the hospital encounter of 05/15/15  Wet prep, genital     Status: Abnormal   Collection Time: 05/15/15 10:29 AM  Result Value Ref Range Status   Yeast Wet Prep HPF POC NONE SEEN NONE SEEN Final   Trich, Wet Prep NONE SEEN NONE SEEN Final   Clue Cells Wet Prep HPF POC NONE SEEN NONE SEEN Final   WBC, Wet Prep HPF POC FEW (A) NONE SEEN Final  Urine culture     Status: None   Collection Time: 05/15/15 10:52 AM  Result Value Ref Range Status   Specimen Description URINE, RANDOM  Final   Special Requests NONE  Final   Culture NO GROWTH 1 DAY  Final   Report Status 05/16/2015 FINAL  Final  Blood culture (routine x 2)     Status: None (Preliminary result)   Collection Time: 05/15/15  1:28 PM   Result Value Ref Range Status   Specimen Description BLOOD LEFT FOREARM  Final   Special Requests BOTTLES DRAWN AEROBIC AND ANAEROBIC 3CC  Final   Culture NO GROWTH < 24 HOURS  Final   Report Status PENDING  Incomplete  Culture, blood (single)     Status: None (Preliminary result)   Collection Time: 05/15/15  4:38 PM  Result Value Ref Range Status   Specimen Description BLOOD RIGHT ARM  Final   Special Requests IN PEDIATRIC BOTTLE 3CC  Final   Culture NO GROWTH < 24 HOURS  Final   Report Status PENDING  Incomplete    Studies/Results: Ct Image Guided Drainage By Percutaneous Catheter  05/16/2015   INDICATION: History of right-sided perinephric abscess. Please perform CT-guided percutaneous drainage catheter placement for therapeutic purposes.  EXAM: CT IMAGE GUIDED DRAINAGE BY PERCUTANEOUS CATHETER  COMPARISON:  CT abdomen and pelvis - 05/15/2015  MEDICATIONS: The patient is currently admitted to the hospital and receiving intravenous antibiotics. The antibiotics were administered within an appropriate time frame prior to the initiation of the procedure.  ANESTHESIA/SEDATION: Fentanyl 150 mcg IV; Versed 5 mg IV  Total Moderate Sedation time  15 minutes  CONTRAST:  None  COMPLICATIONS: None immediate  PROCEDURE: Informed written consent was obtained  from the patient after a discussion of the risks, benefits and alternatives to treatment. The patient was placed prone on the CT gantry and a pre procedural CT was performed re-demonstrating the known abscess/fluid collection within the right perinephric space with dominant ill-defined component measuring approximately 4.7 x 1.5 cm (image 30, series 2). The procedure was planned. A timeout was performed prior to the initiation of the procedure.  The skin overlying the midline of the right back was prepped and draped in the usual sterile fashion. The overlying soft tissues were anesthetized with 1% lidocaine with epinephrine. Appropriate trajectory was  planned with the use of a 22 gauge spinal needle. An 18 gauge trocar needle was advanced into the abscess/fluid collection and a short Amplatz super stiff wire was coiled within the collection. Appropriate positioning was confirmed with a limited CT scan. The tract was serially dilated allowing placement of a 10 [place] all-purpose drainage catheter. Appropriate positioning was confirmed with a limited postprocedural CT scan.  Approximately 10 Ml of purulent fluid was aspirated. The tube was connected to a JP bulb and sutured in place. A dressing was placed. The patient tolerated the procedure well without immediate post procedural complication.  IMPRESSION: Successful CT guided placement of a 10 French all purpose drain catheter into the right perinephric space with aspiration of 10 mL of purulent fluid. Samples were sent to the laboratory as requested by the ordering clinical team.   Electronically Signed   By: Sandi Mariscal M.D.   On: 05/16/2015 14:20    Assessment: Stable post perc drainage.  Plan: Follow. Still concerned re: TB.  Mieka Leaton I Emery Binz 05/16/2015, 3:59 PM

## 2015-05-16 NOTE — Procedures (Signed)
Technically successful CT guided drainage catheter placement into right perinephric space yielding 10 cc of purulent material.   Samples sent to laboratory.   No immediate post procedural complications.   Ronny Bacon, MD Pager #: [phone]

## 2015-05-17 LAB — BASIC METABOLIC PANEL
Anion gap: 9 (ref 5–15)
BUN: <5 mg/dL — ABNORMAL LOW (ref 6–20)
CO2: 23 mmol/L (ref 22–32)
Calcium: 8.1 mg/dL — ABNORMAL LOW (ref 8.9–10.3)
Chloride: 106 mmol/L (ref 101–111)
Creatinine, Ser: 0.62 mg/dL (ref 0.44–1.00)
GFR calc Af Amer: >60 mL/min (ref >60)
GFR calc non Af Amer: >60 mL/min (ref >60)
Glucose, Bld: 89 mg/dL (ref 65–99)
Potassium: 3.5 mmol/L (ref 3.5–5.1)
Sodium: 138 mmol/L (ref 135–145)

## 2015-05-17 LAB — CBC
HCT: 30.3 % — ABNORMAL LOW (ref 36.0–46.0)
Hemoglobin: 9.8 g/dL — ABNORMAL LOW (ref 12.0–15.0)
MCH: 29.2 pg (ref 26.0–34.0)
MCHC: 32.3 g/dL (ref 30.0–36.0)
MCV: 90.2 fL (ref 78.0–100.0)
Platelets: 688 10*3/uL — ABNORMAL HIGH (ref 150–400)
RBC: 3.36 MIL/uL — ABNORMAL LOW (ref 3.87–5.11)
RDW: 15.4 % (ref 11.5–15.5)
WBC: 12.8 10*3/uL — ABNORMAL HIGH (ref 4.0–10.5)
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
## 2015-05-17 NOTE — Progress Notes (Signed)
Patient ID: [name], female   DOB: 07/22/1981, 34 y.o.   MRN: [id number]    Referring Physician(s): [facility]  Chief Complaint:  Right perinephric abscess   Subjective:  Pt feeling better since perinephric drain placed yesterday; denies sig flank pain,n/v  Allergies: Tylenol  Medications: Prior to Admission medications   Medication Sig Start Date End Date Taking? Authorizing Provider  naproxen sodium (ANAPROX) 220 MG tablet Take 220 mg by mouth 2 (two) times daily with a meal.   Yes Historical Provider, MD     Vital Signs: BP 111/58 mmHg  Pulse 84  Temp(Src) 98.6 F (37 C) (Oral)  Resp 18  Ht 5\' 2"  (1.575 m)  Wt 146 lb 5.5 oz (66.382 kg)  BMI 26.76 kg/m2  SpO2 99%  LMP 04/27/2015  Physical Exam awake but sl drowsy; rt perinephric drain intact, insertion site ok, output 25 cc beige fluid; cx's pend(gm + cocci in clusters)  Imaging: Ct Abdomen Pelvis W Contrast  05/15/2015   CLINICAL DATA:  Acute right lower quadrant abdominal pain.  EXAM: CT ABDOMEN AND PELVIS WITH CONTRAST  TECHNIQUE: Multidetector CT imaging of the abdomen and pelvis was performed using the standard protocol following bolus administration of intravenous contrast.  CONTRAST:  174mL OMNIPAQUE IOHEXOL 300 MG/ML  SOLN  COMPARISON:  CT scan of June 14, 2005.  FINDINGS: Mild right posterior basilar opacity is noted concerning for pneumonia or atelectasis with associated small loculated effusion. 16 mm lucency is noted in the medial portion the right iliac wing which is increased compared to prior exam.  Status post cholecystectomy. The liver, spleen and pancreas appear normal. Adrenal glands and left kidney appear normal. Multiple loculated fluid collections are seen in the right pararenal space consistent with multiple abscesses. Minimal right hydroureteronephrosis is noted without obstructing calculus. Urinary bladder appears normal. There is no evidence of bowel obstruction uterus appears normal. Ovaries  appear normal. No significant adenopathy is noted.  IMPRESSION: Minimal right hydroureteronephrosis is noted without obstructing calculus. Multiple small loculated fluid collections are noted around the upper and middle pole of the right kidney most consistent with abscesses secondary to pyelonephritis. Right posterior basilar opacity is noted as well consistent with atelectasis or possibly pneumonia with small loculated pleural effusion.  16 mm lucency seen superiorly in right iliac wing which is more prominent compared to prior exam. This may simply represent benign abnormality, but further evaluation with bone scan or MRI is recommended on nonemergent basis.   Electronically Signed   By: Marijo Conception, M.D.   On: 05/15/2015 12:41   Ct Image Guided Drainage By Percutaneous Catheter  05/16/2015   INDICATION: History of right-sided perinephric abscess. Please perform CT-guided percutaneous drainage catheter placement for therapeutic purposes.  EXAM: CT IMAGE GUIDED DRAINAGE BY PERCUTANEOUS CATHETER  COMPARISON:  CT abdomen and pelvis - 05/15/2015  MEDICATIONS: The patient is currently admitted to the hospital and receiving intravenous antibiotics. The antibiotics were administered within an appropriate time frame prior to the initiation of the procedure.  ANESTHESIA/SEDATION: Fentanyl 150 mcg IV; Versed 5 mg IV  Total Moderate Sedation time  15 minutes  CONTRAST:  None  COMPLICATIONS: None immediate  PROCEDURE: Informed written consent was obtained from the patient after a discussion of the risks, benefits and alternatives to treatment. The patient was placed prone on the CT gantry and a pre procedural CT was performed re-demonstrating the known abscess/fluid collection within the right perinephric space with dominant ill-defined component measuring approximately 4.7 x 1.5 cm (  image 30, series 2). The procedure was planned. A timeout was performed prior to the initiation of the procedure.  The skin overlying  the midline of the right back was prepped and draped in the usual sterile fashion. The overlying soft tissues were anesthetized with 1% lidocaine with epinephrine. Appropriate trajectory was planned with the use of a 22 gauge spinal needle. An 18 gauge trocar needle was advanced into the abscess/fluid collection and a short Amplatz super stiff wire was coiled within the collection. Appropriate positioning was confirmed with a limited CT scan. The tract was serially dilated allowing placement of a 10 [place] all-purpose drainage catheter. Appropriate positioning was confirmed with a limited postprocedural CT scan.  Approximately 10 Ml of purulent fluid was aspirated. The tube was connected to a JP bulb and sutured in place. A dressing was placed. The patient tolerated the procedure well without immediate post procedural complication.  IMPRESSION: Successful CT guided placement of a 10 French all purpose drain catheter into the right perinephric space with aspiration of 10 mL of purulent fluid. Samples were sent to the laboratory as requested by the ordering clinical team.   Electronically Signed   By: Sandi Mariscal M.D.   On: 05/16/2015 14:20    Labs:  CBC:  Recent Labs  07/29/14 1728 05/15/15 0950 05/16/15 0530 05/17/15 0609  WBC 11.7* 13.1* 15.1* 12.8*  HGB 10.6* 11.1* 10.2* 9.8*  HCT 32.1* 33.6* 32.0* 30.3*  PLT 336 790* 752* 688*    COAGS:  Recent Labs  05/16/15 1820  INR 1.41  APTT 33    BMP:  Recent Labs  07/29/14 1728 05/15/15 0950 05/16/15 0530 05/17/15 0609  NA 142 139 140 138  K 3.8 3.4* 3.9 3.5  CL 107 101 110 106  CO2 22 29 26 23   GLUCOSE 99 86 91 89  BUN 14 <5* <5* <5*  CALCIUM 8.7 8.4* 7.9* 8.1*  CREATININE 0.80 0.90 0.56 0.62  GFRNONAA >90 >60 >60 >60  GFRAA >90 >60 >60 >60    LIVER FUNCTION TESTS:  Recent Labs  05/15/15 0950  BILITOT 1.1  AST 29  ALT 23  ALKPHOS 65  PROT 6.6  ALBUMIN 2.1*    Assessment and Plan:  S/p rt perinephric abscess  drainage 7/30; afebrile; WBC down to 12.8(15.1),hgb 9.8(10.2),  creat nl; gm+ cocci in clusters noted in perinephric fluid- check final cx's; blood/urine cx neg to date; check f/u CT once output declines  Signed: D. Rowe Robert 05/17/2015, 10:15 AM   I spent a total of 15 minutes in face to face in clinical consultation/evaluation, greater than 50% of which was counseling/coordinating care for rt perinephric abscess drain

## 2015-05-17 NOTE — Progress Notes (Signed)
Subjective: S: perirenal abscess  F/u. Multi narcotic drug abuser. Pt has perc drainage. C/o flank  Pain.    Objective: Vital signs in last 24 hours: Temp:  [98.6 F (37 C)-99.8 F (37.7 C)] 98.6 F (37 C) (07/31 0530) Pulse Rate:  [82-86] 84 (07/31 0531) Resp:  [16-18] 18 (07/31 0530) BP: (73-118)/(57-68) 111/58 mmHg (07/31 0531) SpO2:  [98 %-99 %] 99 % (07/31 0530) Weight:  [66.382 kg (146 lb 5.5 oz)] 66.382 kg (146 lb 5.5 oz) (07/30 2027)A  Intake/Output from previous day: 07/30 0701 - 07/31 0700 In: 125 [P.O.:120] Out: 25 [Drains:25] Intake/Output this shift:    Past Medical History  Diagnosis Date  . Seizures   . Chronic back pain     Physical Exam:  Lungs - Normal respiratory effort, chest expands symmetrically.  Abdomen - Soft, non-tender & non-distended.  Lab Results:  Recent Labs  05/15/15 0950 05/16/15 0530 05/17/15 0609  WBC 13.1* 15.1* 12.8*  HGB 11.1* 10.2* 9.8*  HCT 33.6* 32.0* 30.3*   BMET  Recent Labs  05/16/15 0530 05/17/15 0609  NA 140 138  K 3.9 3.5  CL 110 106  CO2 26 23  GLUCOSE 91 89  BUN <5* <5*  CREATININE 0.56 0.62  CALCIUM 7.9* 8.1*   No results for input(s): LABURIN in the last 72 hours. Results for orders placed or performed during the hospital encounter of 05/15/15  Wet prep, genital     Status: Abnormal   Collection Time: 05/15/15 10:29 AM  Result Value Ref Range Status   Yeast Wet Prep HPF POC NONE SEEN NONE SEEN Final   Trich, Wet Prep NONE SEEN NONE SEEN Final   Clue Cells Wet Prep HPF POC NONE SEEN NONE SEEN Final   WBC, Wet Prep HPF POC FEW (A) NONE SEEN Final  Urine culture     Status: None   Collection Time: 05/15/15 10:52 AM  Result Value Ref Range Status   Specimen Description URINE, RANDOM  Final   Special Requests NONE  Final   Culture NO GROWTH 1 DAY  Final   Report Status 05/16/2015 FINAL  Final  Blood culture (routine x 2)     Status: None (Preliminary result)   Collection Time: 05/15/15  1:28 PM   Result Value Ref Range Status   Specimen Description BLOOD LEFT FOREARM  Final   Special Requests BOTTLES DRAWN AEROBIC AND ANAEROBIC 3CC  Final   Culture NO GROWTH 2 DAYS  Final   Report Status PENDING  Incomplete  Culture, blood (single)     Status: None (Preliminary result)   Collection Time: 05/15/15  4:38 PM  Result Value Ref Range Status   Specimen Description BLOOD RIGHT ARM  Final   Special Requests IN PEDIATRIC BOTTLE 3CC  Final   Culture NO GROWTH 2 DAYS  Final   Report Status PENDING  Incomplete  Culture, routine-abscess     Status: None (Preliminary result)   Collection Time: 05/16/15  4:00 PM  Result Value Ref Range Status   Specimen Description ABSCESS  Final   Special Requests KIDNEY  Final   Gram Stain   Final    ABUNDANT WBC PRESENT, PREDOMINANTLY PMN NO SQUAMOUS EPITHELIAL CELLS SEEN MODERATE GRAM POSITIVE COCCI IN CLUSTERS Performed at [facility]    Culture   Final    Culture reincubated for better growth Performed at [facility]    Report Status PENDING  Incomplete    Studies/Results: No results found.  Assessment: peri-renal  gram + abscess, well treated with perc drainage and IV antibiotics per Medicine service. No Urologic Rx needed at this time.   Plan: Please re-consult if needed.  [name] 05/17/2015, 3:29 PM

## 2015-05-18 LAB — CBC
HCT: 32.9 % — ABNORMAL LOW (ref 36.0–46.0)
Hemoglobin: 10.6 g/dL — ABNORMAL LOW (ref 12.0–15.0)
MCH: 29 pg (ref 26.0–34.0)
MCHC: 32.2 g/dL (ref 30.0–36.0)
MCV: 90.1 fL (ref 78.0–100.0)
Platelets: 734 10*3/uL — ABNORMAL HIGH (ref 150–400)
RBC: 3.65 MIL/uL — ABNORMAL LOW (ref 3.87–5.11)
RDW: 15.4 % (ref 11.5–15.5)
WBC: 9.8 10*3/uL (ref 4.0–10.5)

## 2015-05-18 LAB — BASIC METABOLIC PANEL
Anion gap: 5 (ref 5–15)
BUN: <5 mg/dL — ABNORMAL LOW (ref 6–20)
CO2: 25 mmol/L (ref 22–32)
Calcium: 8.3 mg/dL — ABNORMAL LOW (ref 8.9–10.3)
Chloride: 109 mmol/L (ref 101–111)
Creatinine, Ser: 0.6 mg/dL (ref 0.44–1.00)
GFR calc Af Amer: >60 mL/min (ref >60)
GFR calc non Af Amer: >60 mL/min (ref >60)
Glucose, Bld: 88 mg/dL (ref 65–99)
Potassium: 3.5 mmol/L (ref 3.5–5.1)
Sodium: 139 mmol/L (ref 135–145)

## 2015-05-18 LAB — GC/CHLAMYDIA PROBE AMP (~~LOC~~) NOT AT ARMC
Chlamydia: NEGATIVE
Neisseria Gonorrhea: NEGATIVE

## 2015-05-18 MED ORDER — OXYCODONE HCL 5 MG PO TABS
5.0000 mg | ORAL_TABLET | ORAL | Status: DC | PRN
  Administered 2015-05-18 – 2015-05-19 (×4): 5 mg via ORAL
  Filled 2015-05-18 (×5): qty 1

## 2015-05-18 MED ORDER — ZOLPIDEM TARTRATE 5 MG PO TABS
5.0000 mg | ORAL_TABLET | Freq: Every evening | ORAL | Status: DC | PRN
  Administered 2015-05-19 – 2015-05-21 (×3): 5 mg via ORAL
  Filled 2015-05-18 (×3): qty 1
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
## 2015-05-18 NOTE — Progress Notes (Signed)
Referring Physician(s): [facility]  Chief Complaint: Right perinephric abscess s/p perc drain  Subjective: Patient with c/o pain at drain site with inspiration. She states overall she feels much better than PTA.   Allergies: Tylenol  Medications: Prior to Admission medications   Medication Sig Start Date End Date Taking? Authorizing Provider  naproxen sodium (ANAPROX) 220 MG tablet Take 220 mg by mouth 2 (two) times daily with a meal.   Yes Historical Provider, MD   Vital Signs: BP 108/62 mmHg  Pulse 72  Temp(Src) 98 F (36.7 C) (Oral)  Resp 18  Ht 5\' 2"  (1.575 m)  Wt 146 lb 11.2 oz (66.543 kg)  BMI 26.83 kg/m2  SpO2 100%  LMP 04/27/2015  Physical Exam General: A&Ox3, NAD Abd: Soft, NT, ND, right flank drain intact, output thick beige, TTP  Imaging: Ct Abdomen Pelvis W Contrast  05/15/2015   CLINICAL DATA:  Acute right lower quadrant abdominal pain.  EXAM: CT ABDOMEN AND PELVIS WITH CONTRAST  TECHNIQUE: Multidetector CT imaging of the abdomen and pelvis was performed using the standard protocol following bolus administration of intravenous contrast.  CONTRAST:  157mL OMNIPAQUE IOHEXOL 300 MG/ML  SOLN  COMPARISON:  CT scan of June 14, 2005.  FINDINGS: Mild right posterior basilar opacity is noted concerning for pneumonia or atelectasis with associated small loculated effusion. 16 mm lucency is noted in the medial portion the right iliac wing which is increased compared to prior exam.  Status post cholecystectomy. The liver, spleen and pancreas appear normal. Adrenal glands and left kidney appear normal. Multiple loculated fluid collections are seen in the right pararenal space consistent with multiple abscesses. Minimal right hydroureteronephrosis is noted without obstructing calculus. Urinary bladder appears normal. There is no evidence of bowel obstruction uterus appears normal. Ovaries appear normal. No significant adenopathy is noted.  IMPRESSION: Minimal right  hydroureteronephrosis is noted without obstructing calculus. Multiple small loculated fluid collections are noted around the upper and middle pole of the right kidney most consistent with abscesses secondary to pyelonephritis. Right posterior basilar opacity is noted as well consistent with atelectasis or possibly pneumonia with small loculated pleural effusion.  16 mm lucency seen superiorly in right iliac wing which is more prominent compared to prior exam. This may simply represent benign abnormality, but further evaluation with bone scan or MRI is recommended on nonemergent basis.   Electronically Signed   By: Marijo Conception, M.D.   On: 05/15/2015 12:41   Ct Image Guided Drainage By Percutaneous Catheter  05/16/2015   INDICATION: History of right-sided perinephric abscess. Please perform CT-guided percutaneous drainage catheter placement for therapeutic purposes.  EXAM: CT IMAGE GUIDED DRAINAGE BY PERCUTANEOUS CATHETER  COMPARISON:  CT abdomen and pelvis - 05/15/2015  MEDICATIONS: The patient is currently admitted to the hospital and receiving intravenous antibiotics. The antibiotics were administered within an appropriate time frame prior to the initiation of the procedure.  ANESTHESIA/SEDATION: Fentanyl 150 mcg IV; Versed 5 mg IV  Total Moderate Sedation time  15 minutes  CONTRAST:  None  COMPLICATIONS: None immediate  PROCEDURE: Informed written consent was obtained from the patient after a discussion of the risks, benefits and alternatives to treatment. The patient was placed prone on the CT gantry and a pre procedural CT was performed re-demonstrating the known abscess/fluid collection within the right perinephric space with dominant ill-defined component measuring approximately 4.7 x 1.5 cm (image 30, series 2). The procedure was planned. A timeout was performed prior to the initiation of the procedure.  The skin overlying the midline of the right back was prepped and draped in the usual sterile  fashion. The overlying soft tissues were anesthetized with 1% lidocaine with epinephrine. Appropriate trajectory was planned with the use of a 22 gauge spinal needle. An 18 gauge trocar needle was advanced into the abscess/fluid collection and a short Amplatz super stiff wire was coiled within the collection. Appropriate positioning was confirmed with a limited CT scan. The tract was serially dilated allowing placement of a 10 [place] all-purpose drainage catheter. Appropriate positioning was confirmed with a limited postprocedural CT scan.  Approximately 10 Ml of purulent fluid was aspirated. The tube was connected to a JP bulb and sutured in place. A dressing was placed. The patient tolerated the procedure well without immediate post procedural complication.  IMPRESSION: Successful CT guided placement of a 10 French all purpose drain catheter into the right perinephric space with aspiration of 10 mL of purulent fluid. Samples were sent to the laboratory as requested by the ordering clinical team.   Electronically Signed   By: Sandi Mariscal M.D.   On: 05/16/2015 14:20    Labs:  CBC:  Recent Labs  05/15/15 0950 05/16/15 0530 05/17/15 0609 05/18/15 0505  WBC 13.1* 15.1* 12.8* 9.8  HGB 11.1* 10.2* 9.8* 10.6*  HCT 33.6* 32.0* 30.3* 32.9*  PLT 790* 752* 688* 734*    COAGS:  Recent Labs  05/16/15 1820  INR 1.41  APTT 33    BMP:  Recent Labs  05/15/15 0950 05/16/15 0530 05/17/15 0609 05/18/15 0505  NA 139 140 138 139  K 3.4* 3.9 3.5 3.5  CL 101 110 106 109  CO2 29 26 23 25   GLUCOSE 86 91 89 88  BUN <5* <5* <5* <5*  CALCIUM 8.4* 7.9* 8.1* 8.3*  CREATININE 0.90 0.56 0.62 0.60  GFRNONAA >60 >60 >60 >60  GFRAA >60 >60 >60 >60    LIVER FUNCTION TESTS:  Recent Labs  05/15/15 0950  BILITOT 1.1  AST 29  ALT 23  ALKPHOS 65  PROT 6.6  ALBUMIN 2.1*    Assessment and Plan: Right perinephric abscess S/p perc drain placed 7/30, wbc trending down-now wnl, afebrile, good output  35cc/24 hrs, Cx staph aureus  Follow output and flush catheter, repeat imaging when output < 10cc/24hrs   Signed: Hedy Jacob 05/18/2015, 10:15 AM   I spent a total of 15 Minutes in face to face in clinical consultation/evaluation, greater than 50% of which was counseling/coordinating care for right perinephric abscess.

## 2015-05-19 DIAGNOSIS — Z720 Tobacco use: Secondary | ICD-10-CM

## 2015-05-19 LAB — CBC
HCT: 35.8 % — ABNORMAL LOW (ref 36.0–46.0)
Hemoglobin: 11.8 g/dL — ABNORMAL LOW (ref 12.0–15.0)
MCH: 29.7 pg (ref 26.0–34.0)
MCHC: 33 g/dL (ref 30.0–36.0)
MCV: 90.2 fL (ref 78.0–100.0)
Platelets: 694 10*3/uL — ABNORMAL HIGH (ref 150–400)
RBC: 3.97 MIL/uL (ref 3.87–5.11)
RDW: 15.3 % (ref 11.5–15.5)
WBC: 8.1 10*3/uL (ref 4.0–10.5)

## 2015-05-19 LAB — BASIC METABOLIC PANEL
Anion gap: 9 (ref 5–15)
BUN: 6 mg/dL (ref 6–20)
CO2: 24 mmol/L (ref 22–32)
Calcium: 8.5 mg/dL — ABNORMAL LOW (ref 8.9–10.3)
Chloride: 107 mmol/L (ref 101–111)
Creatinine, Ser: 0.57 mg/dL (ref 0.44–1.00)
GFR calc Af Amer: >60 mL/min (ref >60)
GFR calc non Af Amer: >60 mL/min (ref >60)
Glucose, Bld: 84 mg/dL (ref 65–99)
Potassium: 3.8 mmol/L (ref 3.5–5.1)
Sodium: 140 mmol/L (ref 135–145)

## 2015-05-19 LAB — CULTURE, ROUTINE-ABSCESS

## 2015-05-19 MED ORDER — ZOLPIDEM TARTRATE 5 MG PO TABS: 5.0000 mg | ORAL_TABLET | Freq: Every evening | ORAL | Status: DC | PRN

## 2015-05-19 NOTE — Progress Notes (Signed)
[facility]                                                                              Patient Demographics  [name], is a 34 y.o. female, DOB - January 06, 1981, OBS:[id number]  Admit date - 05/15/2015   Admitting Physician Theodis Blaze, MD  Outpatient Primary MD for the patient is No PCP Per Patient  LOS - 4   Chief Complaint  Patient presents with  . Abdominal Pain      Interim history 34 year old female history of seizures, chronic back pain, tobacco use and narcotic use presented with abdominal pain and back pain for one week. CT scan showed multiple renal abscesses.  Urology was consulted and recommended IR consult. Drain in place. Currently monitoring output. Will reimage in the next 24-48 hours.  Assessment & Plan   Renal abscesses / pyelonephritis -Most likely causing right flank and right abdominal pain  -Continues to have leukocytosis-however improving, afebrile -CT scan shows multiple small loculated fluid collections around the upper and middle pole of the right kidney most consistent with abscesses secondary to pyelonephritis. - ID recommended ceftriaxone -Urology consulted and appreciated and recommended IR for drain -Patient s/p right perinephric abscess drainage on 05/16/2015. Drain 15cc output -Per IR, once drain output is <10cc/24hr, reimage (spoke IR, may reimage on Wednesday or Thursday) -Continue pain control -Blood cultures show no growth to date, urine culture shows no growth -Perinephric fluid culture: moderate - staph aureus (Spoke with pharmacy, recommended Augmentin, total antibiotics for 10-14days) -Leukocytosis resolved  Hypotension -Resolved with IVF -Continue to monitor  Thrombocytosis -likely acute phase reactant to above -Platelets 694  Narcotic abuse/Polysubstance abuse -patient endorses last use before going to rehab in [facility], fentanyl 100 mcg in ED is not controlling pain -Patient denies any current drug use  however tox screen was positive for benzos, opiates, cocaine, THC -Continue pain control with dilaudid, toradol, however suspect it will be difficult to control her pain due to her drug use -Social work consultation for polysubstance abuse  Abnormal CT scan, pelvis -16 mm lucency seen superiorly in right iliac wing - more prominent than prior exam. -Bone scan or MRI is recommended on nonemergent basis  Chronic anemia -Baseline hemoglobin 10-11, currently 11.8 (with no intervention) -Continue to monitor CBC, currently not bleeding  Code Status: Full  Family Communication: Family at bedside  Disposition Plan: Admitted. Continue to monitor drain output  Time Spent in minutes   30 minutes  Procedures  IR placed perc drain 05/16/2015  Consults   Urology Interventional radiology ID, via phone  DVT Prophylaxis  heparin  Lab Results  Component Value Date   PLT 694* 05/19/2015    Medications  Scheduled Meds: . cefTRIAXone (ROCEPHIN)  IV  2 g Intravenous Q24H  . heparin  5,000 Units Subcutaneous 3 times per day  . pneumococcal 23 valent vaccine  0.5 mL Intramuscular Tomorrow-1000  . sodium chloride  3 mL Intravenous Q12H   Continuous Infusions:   PRN Meds:.alum & mag hydroxide-simeth, HYDROmorphone (DILAUDID) injection, ketorolac, ondansetron **OR** ondansetron (ZOFRAN) IV, oxyCODONE, senna-docusate, zolpidem  Antibiotics    Anti-infectives    Start     Dose/Rate Route Frequency  Ordered Stop   05/16/15 1000  cefTRIAXone (ROCEPHIN) 2 g in dextrose 5 % 50 mL IVPB - Premix     2 g 100 mL/hr over 30 Minutes Intravenous Every 24 hours 05/15/15 1500     05/15/15 1600  cefTRIAXone (ROCEPHIN) 1 g in dextrose 5 % 50 mL IVPB - Premix  Status:  Discontinued    Comments:  Want a total of 2 grams daily.   1 g 100 mL/hr over 30 Minutes Intravenous  Once 05/15/15 1500 05/15/15 1550   05/15/15 1515  cefTRIAXone (ROCEPHIN) 1 g in dextrose 5 % 50 mL IVPB - Premix  Status:  Discontinued      1 g 100 mL/hr over 30 Minutes Intravenous  Once 05/15/15 1504 05/15/15 1623   05/15/15 1300  cefTRIAXone (ROCEPHIN) 1 g in dextrose 5 % 50 mL IVPB     1 g 100 mL/hr over 30 Minutes Intravenous  Once 05/15/15 1245 05/15/15 1414        Subjective:   [name] seen and examined today.  Patient complains of not sleeping. Denies any pain or nausea at this time. Is feeling better than she has. Denies chest pain, shortness of breath  Objective:   Filed Vitals:   05/18/15 0529 05/18/15 0900 05/18/15 2152 05/19/15 0450  BP: 112/74 108/62 113/63 100/61  Pulse: 67 72 74 83  Temp: 97.8 F (36.6 C) 98 F (36.7 C) 98.1 F (36.7 C) 97.8 F (36.6 C)  TempSrc: Oral Oral Oral Oral  Resp: 18 18 20 16   Height:      Weight:   65.499 kg (144 lb 6.4 oz)   SpO2: 100% 100% 97% 100%    Wt Readings from Last 3 Encounters:  05/18/15 65.499 kg (144 lb 6.4 oz)  03/11/15 74.844 kg (165 lb)  07/29/14 65.772 kg (145 lb)     Intake/Output Summary (Last 24 hours) at 05/19/15 1237 Last data filed at 05/19/15 0825  Gross per 24 hour  Intake    610 ml  Output     10 ml  Net    600 ml    Exam  General: Well developed, well nourished, mild distress  HEENT: NCAT, mucous membranes moist.   Cardiovascular: S1 S2 auscultated, RRR, no murmurs  Respiratory: Clear to auscultation  Abdomen: Soft, right sided TTP, nondistended, + bowel sounds. Drain in place.  Extremities: warm dry without cyanosis clubbing or edema  Neuro: AAOx3,nonfocal  Data Review   Micro Results Recent Results (from the past 240 hour(s))  Wet prep, genital     Status: Abnormal   Collection Time: 05/15/15 10:29 AM  Result Value Ref Range Status   Yeast Wet Prep HPF POC NONE SEEN NONE SEEN Final   Trich, Wet Prep NONE SEEN NONE SEEN Final   Clue Cells Wet Prep HPF POC NONE SEEN NONE SEEN Final   WBC, Wet Prep HPF POC FEW (A) NONE SEEN Final  Urine culture     Status: None   Collection Time: 05/15/15 10:52 AM    Result Value Ref Range Status   Specimen Description URINE, RANDOM  Final   Special Requests NONE  Final   Culture NO GROWTH 1 DAY  Final   Report Status 05/16/2015 FINAL  Final  Blood culture (routine x 2)     Status: None (Preliminary result)   Collection Time: 05/15/15  1:28 PM  Result Value Ref Range Status   Specimen Description BLOOD LEFT FOREARM  Final   Special Requests BOTTLES  DRAWN AEROBIC AND ANAEROBIC 3CC  Final   Culture NO GROWTH 3 DAYS  Final   Report Status PENDING  Incomplete  Culture, blood (single)     Status: None (Preliminary result)   Collection Time: 05/15/15  4:38 PM  Result Value Ref Range Status   Specimen Description BLOOD RIGHT ARM  Final   Special Requests IN PEDIATRIC BOTTLE 3CC  Final   Culture NO GROWTH 3 DAYS  Final   Report Status PENDING  Incomplete  Culture, routine-abscess     Status: None   Collection Time: 05/16/15  4:00 PM  Result Value Ref Range Status   Specimen Description ABSCESS  Final   Special Requests KIDNEY  Final   Gram Stain   Final    ABUNDANT WBC PRESENT, PREDOMINANTLY PMN NO SQUAMOUS EPITHELIAL CELLS SEEN MODERATE GRAM POSITIVE COCCI IN CLUSTERS Performed at [facility]    Culture   Final    MODERATE STAPHYLOCOCCUS AUREUS Note: RIFAMPIN AND GENTAMICIN SHOULD NOT BE USED AS SINGLE DRUGS FOR TREATMENT OF STAPH INFECTIONS. Performed at [facility]    Report Status 05/19/2015 FINAL  Final   Organism ID, Bacteria STAPHYLOCOCCUS AUREUS  Final      Susceptibility   Staphylococcus aureus - MIC*    CLINDAMYCIN <=0.25 SENSITIVE Sensitive     ERYTHROMYCIN 0.5 SENSITIVE Sensitive     GENTAMICIN <=0.5 SENSITIVE Sensitive     LEVOFLOXACIN 0.25 SENSITIVE Sensitive     OXACILLIN 0.5 SENSITIVE Sensitive     RIFAMPIN <=0.5 SENSITIVE Sensitive     TRIMETH/SULFA <=10 SENSITIVE Sensitive     VANCOMYCIN <=0.5 SENSITIVE Sensitive     TETRACYCLINE >=16 RESISTANT Resistant     MOXIFLOXACIN <=0.25 SENSITIVE Sensitive      * MODERATE STAPHYLOCOCCUS AUREUS    Radiology Reports Ct Abdomen Pelvis W Contrast  05/15/2015   CLINICAL DATA:  Acute right lower quadrant abdominal pain.  EXAM: CT ABDOMEN AND PELVIS WITH CONTRAST  TECHNIQUE: Multidetector CT imaging of the abdomen and pelvis was performed using the standard protocol following bolus administration of intravenous contrast.  CONTRAST:  135mL OMNIPAQUE IOHEXOL 300 MG/ML  SOLN  COMPARISON:  CT scan of June 14, 2005.  FINDINGS: Mild right posterior basilar opacity is noted concerning for pneumonia or atelectasis with associated small loculated effusion. 16 mm lucency is noted in the medial portion the right iliac wing which is increased compared to prior exam.  Status post cholecystectomy. The liver, spleen and pancreas appear normal. Adrenal glands and left kidney appear normal. Multiple loculated fluid collections are seen in the right pararenal space consistent with multiple abscesses. Minimal right hydroureteronephrosis is noted without obstructing calculus. Urinary bladder appears normal. There is no evidence of bowel obstruction uterus appears normal. Ovaries appear normal. No significant adenopathy is noted.  IMPRESSION: Minimal right hydroureteronephrosis is noted without obstructing calculus. Multiple small loculated fluid collections are noted around the upper and middle pole of the right kidney most consistent with abscesses secondary to pyelonephritis. Right posterior basilar opacity is noted as well consistent with atelectasis or possibly pneumonia with small loculated pleural effusion.  16 mm lucency seen superiorly in right iliac wing which is more prominent compared to prior exam. This may simply represent benign abnormality, but further evaluation with bone scan or MRI is recommended on nonemergent basis.   Electronically Signed   By: Marijo Conception, M.D.   On: 05/15/2015 12:41   Ct Image Guided Drainage By Percutaneous Catheter  05/16/2015   INDICATION:  History of right-sided perinephric abscess. Please perform CT-guided percutaneous drainage catheter placement for therapeutic purposes.  EXAM: CT IMAGE GUIDED DRAINAGE BY PERCUTANEOUS CATHETER  COMPARISON:  CT abdomen and pelvis - 05/15/2015  MEDICATIONS: The patient is currently admitted to the hospital and receiving intravenous antibiotics. The antibiotics were administered within an appropriate time frame prior to the initiation of the procedure.  ANESTHESIA/SEDATION: Fentanyl 150 mcg IV; Versed 5 mg IV  Total Moderate Sedation time  15 minutes  CONTRAST:  None  COMPLICATIONS: None immediate  PROCEDURE: Informed written consent was obtained from the patient after a discussion of the risks, benefits and alternatives to treatment. The patient was placed prone on the CT gantry and a pre procedural CT was performed re-demonstrating the known abscess/fluid collection within the right perinephric space with dominant ill-defined component measuring approximately 4.7 x 1.5 cm (image 30, series 2). The procedure was planned. A timeout was performed prior to the initiation of the procedure.  The skin overlying the midline of the right back was prepped and draped in the usual sterile fashion. The overlying soft tissues were anesthetized with 1% lidocaine with epinephrine. Appropriate trajectory was planned with the use of a 22 gauge spinal needle. An 18 gauge trocar needle was advanced into the abscess/fluid collection and a short Amplatz super stiff wire was coiled within the collection. Appropriate positioning was confirmed with a limited CT scan. The tract was serially dilated allowing placement of a 10 [place] all-purpose drainage catheter. Appropriate positioning was confirmed with a limited postprocedural CT scan.  Approximately 10 Ml of purulent fluid was aspirated. The tube was connected to a JP bulb and sutured in place. A dressing was placed. The patient tolerated the procedure well without immediate post  procedural complication.  IMPRESSION: Successful CT guided placement of a 10 French all purpose drain catheter into the right perinephric space with aspiration of 10 mL of purulent fluid. Samples were sent to the laboratory as requested by the ordering clinical team.   Electronically Signed   By: Sandi Mariscal M.D.   On: 05/16/2015 14:20    CBC  Recent Labs Lab 05/15/15 0950 05/16/15 0530 05/17/15 0609 05/18/15 0505 05/19/15 0751  WBC 13.1* 15.1* 12.8* 9.8 8.1  HGB 11.1* 10.2* 9.8* 10.6* 11.8*  HCT 33.6* 32.0* 30.3* 32.9* 35.8*  PLT 790* 752* 688* 734* 694*  MCV 89.6 90.7 90.2 90.1 90.2  MCH 29.6 28.9 29.2 29.0 29.7  MCHC 33.0 31.9 32.3 32.2 33.0  RDW 15.1 15.6* 15.4 15.4 15.3  LYMPHSABS 3.5  --   --   --   --   MONOABS 0.6  --   --   --   --   EOSABS 0.3  --   --   --   --   BASOSABS 0.0  --   --   --   --     Chemistries   Recent Labs Lab 05/15/15 0950 05/16/15 0530 05/17/15 0609 05/18/15 0505 05/19/15 0751  NA 139 140 138 139 140  K 3.4* 3.9 3.5 3.5 3.8  CL 101 110 106 109 107  CO2 29 26 23 25 24   GLUCOSE 86 91 89 88 84  BUN <5* <5* <5* <5* 6  CREATININE 0.90 0.56 0.62 0.60 0.57  CALCIUM 8.4* 7.9* 8.1* 8.3* 8.5*  AST 29  --   --   --   --   ALT 23  --   --   --   --   ALKPHOS 65  --   --   --   --  BILITOT 1.1  --   --   --   --    ------------------------------------------------------------------------------------------------------------------ estimated creatinine clearance is 89.7 mL/min (by C-G formula based on Cr of 0.57). ------------------------------------------------------------------------------------------------------------------ No results for input(s): HGBA1C in the last 72 hours. ------------------------------------------------------------------------------------------------------------------ No results for input(s): CHOL, HDL, LDLCALC, TRIG, CHOLHDL, LDLDIRECT in the last 72  hours. ------------------------------------------------------------------------------------------------------------------ No results for input(s): TSH, T4TOTAL, T3FREE, THYROIDAB in the last 72 hours.  Invalid input(s): FREET3 ------------------------------------------------------------------------------------------------------------------ No results for input(s): VITAMINB12, FOLATE, FERRITIN, TIBC, IRON, RETICCTPCT in the last 72 hours.  Coagulation profile  Recent Labs Lab 05/16/15 1820  INR 1.41    No results for input(s): DDIMER in the last 72 hours.  Cardiac Enzymes No results for input(s): CKMB, TROPONINI, MYOGLOBIN in the last 168 hours.  Invalid input(s): CK ------------------------------------------------------------------------------------------------------------------ Invalid input(s): POCBNP    [name] D.O. on 05/19/2015 at 12:37 PM  Between 7am to 7pm - Pager - [phone]  After 7pm go to [email or web address] - password [email or web address]  And look for the night coverage person covering for me after hours  [facility] Office  [phone]

## 2015-05-19 NOTE — Progress Notes (Signed)
Referring Physician(s): [facility]  Chief Complaint: Right perinephric abscess s/p perc drain  Subjective: Patient is eager to go home soon, denies any worsening pain but still with soreness at drain site. She denies any fevers  Allergies: Tylenol  Medications: Prior to Admission medications   Medication Sig Start Date End Date Taking? Authorizing Provider  naproxen sodium (ANAPROX) 220 MG tablet Take 220 mg by mouth 2 (two) times daily with a meal.   Yes Historical Provider, MD   Vital Signs: BP 100/61 mmHg  Pulse 83  Temp(Src) 97.8 F (36.6 C) (Oral)  Resp 16  Ht 5\' 2"  (1.575 m)  Wt 144 lb 6.4 oz (65.499 kg)  BMI 26.40 kg/m2  SpO2 100%  LMP 04/27/2015  Physical Exam General: A&Ox3, NAD, eating lunch Abd: Soft, NT, ND, right flank drain intact, output thick beige 5 cc in bulb, TTP  Imaging: Ct Image Guided Drainage By Percutaneous Catheter  05/16/2015   INDICATION: History of right-sided perinephric abscess. Please perform CT-guided percutaneous drainage catheter placement for therapeutic purposes.  EXAM: CT IMAGE GUIDED DRAINAGE BY PERCUTANEOUS CATHETER  COMPARISON:  CT abdomen and pelvis - 05/15/2015  MEDICATIONS: The patient is currently admitted to the hospital and receiving intravenous antibiotics. The antibiotics were administered within an appropriate time frame prior to the initiation of the procedure.  ANESTHESIA/SEDATION: Fentanyl 150 mcg IV; Versed 5 mg IV  Total Moderate Sedation time  15 minutes  CONTRAST:  None  COMPLICATIONS: None immediate  PROCEDURE: Informed written consent was obtained from the patient after a discussion of the risks, benefits and alternatives to treatment. The patient was placed prone on the CT gantry and a pre procedural CT was performed re-demonstrating the known abscess/fluid collection within the right perinephric space with dominant ill-defined component measuring approximately 4.7 x 1.5 cm (image 30, series 2). The procedure was planned. A  timeout was performed prior to the initiation of the procedure.  The skin overlying the midline of the right back was prepped and draped in the usual sterile fashion. The overlying soft tissues were anesthetized with 1% lidocaine with epinephrine. Appropriate trajectory was planned with the use of a 22 gauge spinal needle. An 18 gauge trocar needle was advanced into the abscess/fluid collection and a short Amplatz super stiff wire was coiled within the collection. Appropriate positioning was confirmed with a limited CT scan. The tract was serially dilated allowing placement of a 10 [place] all-purpose drainage catheter. Appropriate positioning was confirmed with a limited postprocedural CT scan.  Approximately 10 Ml of purulent fluid was aspirated. The tube was connected to a JP bulb and sutured in place. A dressing was placed. The patient tolerated the procedure well without immediate post procedural complication.  IMPRESSION: Successful CT guided placement of a 10 French all purpose drain catheter into the right perinephric space with aspiration of 10 mL of purulent fluid. Samples were sent to the laboratory as requested by the ordering clinical team.   Electronically Signed   By: Sandi Mariscal M.D.   On: 05/16/2015 14:20    Labs:  CBC:  Recent Labs  05/16/15 0530 05/17/15 0609 05/18/15 0505 05/19/15 0751  WBC 15.1* 12.8* 9.8 8.1  HGB 10.2* 9.8* 10.6* 11.8*  HCT 32.0* 30.3* 32.9* 35.8*  PLT 752* 688* 734* 694*    COAGS:  Recent Labs  05/16/15 1820  INR 1.41  APTT 33    BMP:  Recent Labs  05/16/15 0530 05/17/15 0609 05/18/15 0505 05/19/15 0751  NA 140 138  139 140  K 3.9 3.5 3.5 3.8  CL 110 106 109 107  CO2 26 23 25 24   GLUCOSE 91 89 88 84  BUN <5* <5* <5* 6  CALCIUM 7.9* 8.1* 8.3* 8.5*  CREATININE 0.56 0.62 0.60 0.57  GFRNONAA >60 >60 >60 >60  GFRAA >60 >60 >60 >60    LIVER FUNCTION TESTS:  Recent Labs  05/15/15 0950  BILITOT 1.1  AST 29  ALT 23  ALKPHOS 65    PROT 6.6  ALBUMIN 2.1*    Assessment and Plan: Right perinephric abscess S/p perc drain placed 7/30, wbc trending down-now wnl, afebrile, less output 10cc/24 hrs (35cc), Cx staph aureus  Follow output and flush catheter, repeat imaging when output < 10cc/24hrs consistently-possibly tomorrow or Thursday   Signed: Tsosie Billing D 05/19/2015, 12:57 PM   I spent a total of 15 Minutes in face to face in clinical consultation/evaluation, greater than 50% of which was counseling/coordinating care for right perinephric abscess

## 2015-05-20 DIAGNOSIS — N151 Renal and perinephric abscess: Principal | ICD-10-CM

## 2015-05-20 LAB — CULTURE, BLOOD (SINGLE): Culture: NO GROWTH

## 2015-05-20 LAB — CULTURE, BLOOD (ROUTINE X 2): Culture: NO GROWTH

## 2015-05-20 MED ORDER — OXYCODONE HCL 5 MG PO TABS
5.0000 mg | ORAL_TABLET | ORAL | Status: DC | PRN
  Administered 2015-05-20: 10 mg via ORAL
  Administered 2015-05-20: 5 mg via ORAL
  Administered 2015-05-20: 10 mg via ORAL
  Administered 2015-05-20: 5 mg via ORAL
  Administered 2015-05-21 – 2015-05-22 (×8): 10 mg via ORAL
  Filled 2015-05-20 (×11): qty 2

## 2015-05-20 MED ORDER — KETOROLAC TROMETHAMINE 15 MG/ML IJ SOLN
15.0000 mg | Freq: Four times a day (QID) | INTRAMUSCULAR | Status: DC | PRN
  Administered 2015-05-20 – 2015-05-22 (×8): 15 mg via INTRAVENOUS
  Filled 2015-05-20 (×8): qty 1

## 2015-05-20 NOTE — Progress Notes (Signed)
[facility]                                                                              Patient Demographics  [name], is a 34 y.o. female, DOB - April 15, 1981, IEP:[id number]  Admit date - 05/15/2015   Admitting Physician Theodis Blaze, MD  Outpatient Primary MD for the patient is No PCP Per Patient  LOS - 5   Chief Complaint  Patient presents with  . Abdominal Pain      Interim history 34 year old female history of seizures, chronic back pain, tobacco use and narcotic use presented with abdominal pain and back pain for one week. CT scan showed multiple renal abscesses.  Urology was consulted and recommended IR consult. Drain in place. Currently monitoring output. Will reimage in the next 24-48 hours.  Assessment & Plan   Renal abscesses / pyelonephritis -Most likely causing right flank and right abdominal pain  -Continues to have leukocytosis-however improving, afebrile -CT scan shows multiple small loculated fluid collections around the upper and middle pole of the right kidney most consistent with abscesses secondary to pyelonephritis. - ID recommended ceftriaxone -Urology consulted and appreciated and recommended IR for drain -Patient s/p right perinephric abscess drainage on 05/16/2015. Drain 15cc output -Per IR, once drain output is <10cc/24hr, reimage (spoke IR, may reimage on Wednesday or Thursday) -Continue pain control -Blood cultures show no growth to date, urine culture shows no growth -Perinephric fluid culture: moderate - staph aureus length of therapy to be determined based on abscess resolution -Leukocytosis resolved  Hypotension -Resolved with IVF -Continue to monitor  Thrombocytosis -likely acute phase reactant to above -Platelets 694  Narcotic abuse/Polysubstance abuse -patient endorsed last use before going to rehab in [facility] - Tricities, fentanyl 100 mcg in ED is not controlling pain -Patient denies any current drug use however tox screen  was positive for benzos, opiates, cocaine, THC -Continue pain control with toradol and oxycodone  Abnormal CT scan, pelvis -16 mm lucency seen superiorly in right iliac wing - more prominent than prior exam. -Bone scan or MRI is recommended on nonemergent basis  Chronic anemia -Baseline hemoglobin 10-11, currently 11.8 (with no intervention) -Continue to monitor CBC, currently not bleeding  Code Status: Full  Family Communication: Family at bedside  Disposition Plan: Admitted. Continue to monitor drain output  Time Spent in minutes   30 minutes  Procedures  IR placed perc drain 05/16/2015  Consults   Urology Interventional radiology ID, via phone  DVT Prophylaxis  heparin  Lab Results  Component Value Date   PLT 694* 05/19/2015    Medications  Scheduled Meds: . cefTRIAXone (ROCEPHIN)  IV  2 g Intravenous Q24H  . heparin  5,000 Units Subcutaneous 3 times per day  . pneumococcal 23 valent vaccine  0.5 mL Intramuscular Tomorrow-1000  . sodium chloride  3 mL Intravenous Q12H   Continuous Infusions:   PRN Meds:.alum & mag hydroxide-simeth, ondansetron **OR** ondansetron (ZOFRAN) IV, oxyCODONE, senna-docusate, zolpidem  Antibiotics    Anti-infectives    Start     Dose/Rate Route Frequency Ordered Stop   05/16/15 1000  cefTRIAXone (ROCEPHIN) 2 g in dextrose 5 % 50 mL IVPB - Premix  2 g 100 mL/hr over 30 Minutes Intravenous Every 24 hours 05/15/15 1500     05/15/15 1600  cefTRIAXone (ROCEPHIN) 1 g in dextrose 5 % 50 mL IVPB - Premix  Status:  Discontinued    Comments:  Want a total of 2 grams daily.   1 g 100 mL/hr over 30 Minutes Intravenous  Once 05/15/15 1500 05/15/15 1550   05/15/15 1515  cefTRIAXone (ROCEPHIN) 1 g in dextrose 5 % 50 mL IVPB - Premix  Status:  Discontinued     1 g 100 mL/hr over 30 Minutes Intravenous  Once 05/15/15 1504 05/15/15 1623   05/15/15 1300  cefTRIAXone (ROCEPHIN) 1 g in dextrose 5 % 50 mL IVPB     1 g 100 mL/hr over 30 Minutes  Intravenous  Once 05/15/15 1245 05/15/15 1414        Subjective:   [name] has no new complaints. Still feeling discomfort  Objective:   Filed Vitals:   05/19/15 2138 05/20/15 0457 05/20/15 1000 05/20/15 1730  BP: 111/65 112/59 95/55 101/65  Pulse: 71 54 65 66  Temp: 98.5 F (36.9 C) 98.2 F (36.8 C) 98 F (36.7 C) 98.2 F (36.8 C)  TempSrc: Oral Oral Oral Oral  Resp: 18 16 18 18   Height:      Weight:      SpO2: 100% 100% 98% 96%    Wt Readings from Last 3 Encounters:  05/18/15 65.499 kg (144 lb 6.4 oz)  03/11/15 74.844 kg (165 lb)  07/29/14 65.772 kg (145 lb)     Intake/Output Summary (Last 24 hours) at 05/20/15 1746 Last data filed at 05/20/15 1700  Gross per 24 hour  Intake    740 ml  Output     17 ml  Net    723 ml    Exam  General: Well developed, well nourished, mild distress  HEENT: NCAT, mucous membranes moist.   Cardiovascular: S1 S2 auscultated, RRR, no murmurs  Respiratory: Clear to auscultation, no wheezes  Abdomen: Soft, right sided TTP, nondistended, + bowel sounds. Drain in place.  Extremities: warm dry without cyanosis clubbing or edema  Neuro: AAOx3,nonfocal  Data Review   Micro Results Recent Results (from the past 240 hour(s))  Wet prep, genital     Status: Abnormal   Collection Time: 05/15/15 10:29 AM  Result Value Ref Range Status   Yeast Wet Prep HPF POC NONE SEEN NONE SEEN Final   Trich, Wet Prep NONE SEEN NONE SEEN Final   Clue Cells Wet Prep HPF POC NONE SEEN NONE SEEN Final   WBC, Wet Prep HPF POC FEW (A) NONE SEEN Final  Urine culture     Status: None   Collection Time: 05/15/15 10:52 AM  Result Value Ref Range Status   Specimen Description URINE, RANDOM  Final   Special Requests NONE  Final   Culture NO GROWTH 1 DAY  Final   Report Status 05/16/2015 FINAL  Final  Blood culture (routine x 2)     Status: None   Collection Time: 05/15/15  1:28 PM  Result Value Ref Range Status   Specimen Description BLOOD  LEFT FOREARM  Final   Special Requests BOTTLES DRAWN AEROBIC AND ANAEROBIC 3CC  Final   Culture NO GROWTH 5 DAYS  Final   Report Status 05/20/2015 FINAL  Final  Culture, blood (single)     Status: None   Collection Time: 05/15/15  4:38 PM  Result Value Ref Range Status   Specimen Description BLOOD  RIGHT ARM  Final   Special Requests IN PEDIATRIC BOTTLE 3CC  Final   Culture NO GROWTH 5 DAYS  Final   Report Status 05/20/2015 FINAL  Final  Culture, routine-abscess     Status: None   Collection Time: 05/16/15  4:00 PM  Result Value Ref Range Status   Specimen Description ABSCESS  Final   Special Requests KIDNEY  Final   Gram Stain   Final    ABUNDANT WBC PRESENT, PREDOMINANTLY PMN NO SQUAMOUS EPITHELIAL CELLS SEEN MODERATE GRAM POSITIVE COCCI IN CLUSTERS Performed at [facility]    Culture   Final    MODERATE STAPHYLOCOCCUS AUREUS Note: RIFAMPIN AND GENTAMICIN SHOULD NOT BE USED AS SINGLE DRUGS FOR TREATMENT OF STAPH INFECTIONS. Performed at [facility]    Report Status 05/19/2015 FINAL  Final   Organism ID, Bacteria STAPHYLOCOCCUS AUREUS  Final      Susceptibility   Staphylococcus aureus - MIC*    CLINDAMYCIN <=0.25 SENSITIVE Sensitive     ERYTHROMYCIN 0.5 SENSITIVE Sensitive     GENTAMICIN <=0.5 SENSITIVE Sensitive     LEVOFLOXACIN 0.25 SENSITIVE Sensitive     OXACILLIN 0.5 SENSITIVE Sensitive     RIFAMPIN <=0.5 SENSITIVE Sensitive     TRIMETH/SULFA <=10 SENSITIVE Sensitive     VANCOMYCIN <=0.5 SENSITIVE Sensitive     TETRACYCLINE >=16 RESISTANT Resistant     MOXIFLOXACIN <=0.25 SENSITIVE Sensitive     * MODERATE STAPHYLOCOCCUS AUREUS    Radiology Reports Ct Abdomen Pelvis W Contrast  05/15/2015   CLINICAL DATA:  Acute right lower quadrant abdominal pain.  EXAM: CT ABDOMEN AND PELVIS WITH CONTRAST  TECHNIQUE: Multidetector CT imaging of the abdomen and pelvis was performed using the standard protocol following bolus administration of intravenous contrast.   CONTRAST:  141mL OMNIPAQUE IOHEXOL 300 MG/ML  SOLN  COMPARISON:  CT scan of June 14, 2005.  FINDINGS: Mild right posterior basilar opacity is noted concerning for pneumonia or atelectasis with associated small loculated effusion. 16 mm lucency is noted in the medial portion the right iliac wing which is increased compared to prior exam.  Status post cholecystectomy. The liver, spleen and pancreas appear normal. Adrenal glands and left kidney appear normal. Multiple loculated fluid collections are seen in the right pararenal space consistent with multiple abscesses. Minimal right hydroureteronephrosis is noted without obstructing calculus. Urinary bladder appears normal. There is no evidence of bowel obstruction uterus appears normal. Ovaries appear normal. No significant adenopathy is noted.  IMPRESSION: Minimal right hydroureteronephrosis is noted without obstructing calculus. Multiple small loculated fluid collections are noted around the upper and middle pole of the right kidney most consistent with abscesses secondary to pyelonephritis. Right posterior basilar opacity is noted as well consistent with atelectasis or possibly pneumonia with small loculated pleural effusion.  16 mm lucency seen superiorly in right iliac wing which is more prominent compared to prior exam. This may simply represent benign abnormality, but further evaluation with bone scan or MRI is recommended on nonemergent basis.   Electronically Signed   By: Marijo Conception, M.D.   On: 05/15/2015 12:41   Ct Image Guided Drainage By Percutaneous Catheter  05/16/2015   INDICATION: History of right-sided perinephric abscess. Please perform CT-guided percutaneous drainage catheter placement for therapeutic purposes.  EXAM: CT IMAGE GUIDED DRAINAGE BY PERCUTANEOUS CATHETER  COMPARISON:  CT abdomen and pelvis - 05/15/2015  MEDICATIONS: The patient is currently admitted to the hospital and receiving intravenous antibiotics. The antibiotics were  administered within an  appropriate time frame prior to the initiation of the procedure.  ANESTHESIA/SEDATION: Fentanyl 150 mcg IV; Versed 5 mg IV  Total Moderate Sedation time  15 minutes  CONTRAST:  None  COMPLICATIONS: None immediate  PROCEDURE: Informed written consent was obtained from the patient after a discussion of the risks, benefits and alternatives to treatment. The patient was placed prone on the CT gantry and a pre procedural CT was performed re-demonstrating the known abscess/fluid collection within the right perinephric space with dominant ill-defined component measuring approximately 4.7 x 1.5 cm (image 30, series 2). The procedure was planned. A timeout was performed prior to the initiation of the procedure.  The skin overlying the midline of the right back was prepped and draped in the usual sterile fashion. The overlying soft tissues were anesthetized with 1% lidocaine with epinephrine. Appropriate trajectory was planned with the use of a 22 gauge spinal needle. An 18 gauge trocar needle was advanced into the abscess/fluid collection and a short Amplatz super stiff wire was coiled within the collection. Appropriate positioning was confirmed with a limited CT scan. The tract was serially dilated allowing placement of a 10 [place] all-purpose drainage catheter. Appropriate positioning was confirmed with a limited postprocedural CT scan.  Approximately 10 Ml of purulent fluid was aspirated. The tube was connected to a JP bulb and sutured in place. A dressing was placed. The patient tolerated the procedure well without immediate post procedural complication.  IMPRESSION: Successful CT guided placement of a 10 French all purpose drain catheter into the right perinephric space with aspiration of 10 mL of purulent fluid. Samples were sent to the laboratory as requested by the ordering clinical team.   Electronically Signed   By: Sandi Mariscal M.D.   On: 05/16/2015 14:20    CBC  Recent Labs Lab  05/15/15 0950 05/16/15 0530 05/17/15 0609 05/18/15 0505 05/19/15 0751  WBC 13.1* 15.1* 12.8* 9.8 8.1  HGB 11.1* 10.2* 9.8* 10.6* 11.8*  HCT 33.6* 32.0* 30.3* 32.9* 35.8*  PLT 790* 752* 688* 734* 694*  MCV 89.6 90.7 90.2 90.1 90.2  MCH 29.6 28.9 29.2 29.0 29.7  MCHC 33.0 31.9 32.3 32.2 33.0  RDW 15.1 15.6* 15.4 15.4 15.3  LYMPHSABS 3.5  --   --   --   --   MONOABS 0.6  --   --   --   --   EOSABS 0.3  --   --   --   --   BASOSABS 0.0  --   --   --   --     Chemistries   Recent Labs Lab 05/15/15 0950 05/16/15 0530 05/17/15 0609 05/18/15 0505 05/19/15 0751  NA 139 140 138 139 140  K 3.4* 3.9 3.5 3.5 3.8  CL 101 110 106 109 107  CO2 29 26 23 25 24   GLUCOSE 86 91 89 88 84  BUN <5* <5* <5* <5* 6  CREATININE 0.90 0.56 0.62 0.60 0.57  CALCIUM 8.4* 7.9* 8.1* 8.3* 8.5*  AST 29  --   --   --   --   ALT 23  --   --   --   --   ALKPHOS 65  --   --   --   --   BILITOT 1.1  --   --   --   --    ------------------------------------------------------------------------------------------------------------------ estimated creatinine clearance is 89.7 mL/min (by C-G formula based on Cr of 0.57). ------------------------------------------------------------------------------------------------------------------ No results for input(s): HGBA1C in the last 72  hours. ------------------------------------------------------------------------------------------------------------------ No results for input(s): CHOL, HDL, LDLCALC, TRIG, CHOLHDL, LDLDIRECT in the last 72 hours. ------------------------------------------------------------------------------------------------------------------ No results for input(s): TSH, T4TOTAL, T3FREE, THYROIDAB in the last 72 hours.  Invalid input(s): FREET3 ------------------------------------------------------------------------------------------------------------------ No results for input(s): VITAMINB12, FOLATE, FERRITIN, TIBC, IRON, RETICCTPCT in the last 72  hours.  Coagulation profile  Recent Labs Lab 05/16/15 1820  INR 1.41    No results for input(s): DDIMER in the last 72 hours.  Cardiac Enzymes No results for input(s): CKMB, TROPONINI, MYOGLOBIN in the last 168 hours.  Invalid input(s): CK ------------------------------------------------------------------------------------------------------------------ Invalid input(s): POCBNP    Lilliane Sposito D.O. on 05/20/2015 at 5:46 PM  Between 7am to 7pm - Pager - [phone]  After 7pm go to [email or web address] - password [email or web address]  And look for the night coverage person covering for me after hours  [facility] Office  [phone]

## 2015-05-20 NOTE — Progress Notes (Signed)
Case and imaging discussed with Dr. Earleen Newport today and patient is to continue right perinephric drain until output consistently < 10cc/24 hrs most likely next week. She can follow-up with IR in drain clinic with repeat imaging at that time if discharged.  Tsosie Billing PA-C Interventional Radiology  05/20/15  8:44 AM

## 2015-05-21 ENCOUNTER — Other Ambulatory Visit: Payer: Self-pay | Admitting: Radiology

## 2015-05-21 DIAGNOSIS — N151 Renal and perinephric abscess: Secondary | ICD-10-CM

## 2015-05-21 NOTE — Progress Notes (Signed)
[facility]                                                                              Patient Demographics  [name], is a 34 y.o. female, DOB - 12/29/1980, IZT:[id number]  Admit date - 05/15/2015   Admitting Physician Theodis Blaze, MD  Outpatient Primary MD for the patient is No PCP Per Patient  LOS - 6   Chief Complaint  Patient presents with  . Abdominal Pain      Interim history 34 year old female history of seizures, chronic back pain, tobacco use and narcotic use presented with abdominal pain and back pain for one week. CT scan showed multiple renal abscesses.  Urology was consulted and recommended IR consult. Drain in place. Currently monitoring output. Will reimage in the next 24-48 hours.  Assessment & Plan   Renal abscesses / pyelonephritis -Most likely causing right flank and right abdominal pain  -Continues to have leukocytosis-however improving, afebrile -CT scan shows multiple small loculated fluid collections around the upper and middle pole of the right kidney most consistent with abscesses secondary to pyelonephritis. - ID recommended ceftriaxone -Urology consulted and appreciated and recommended IR for drain -Patient s/p right perinephric abscess drainage on 05/16/2015. Drain 15cc output -Per IR, once drain output is <10cc/24hr, reimage (IR considering reimaging on 05/22/15) -Continue pain control -Blood cultures show no growth to date, urine culture shows no growth -Perinephric fluid culture: moderate - staph aureus length of therapy to be determined based on abscess resolution -Leukocytosis resolved  Hypotension -Resolved with IVF -Continue to monitor  Thrombocytosis -likely acute phase reactant to above -Platelets 694  Narcotic abuse/Polysubstance abuse -patient endorsed last use before going to rehab in [facility], fentanyl 100 mcg in ED is not controlling pain -Patient denies any current drug use however tox screen was positive  for benzos, opiates, cocaine, THC -Continue pain control with toradol and oxycodone  Abnormal CT scan, pelvis -16 mm lucency seen superiorly in right iliac wing - more prominent than prior exam. -Bone scan or MRI is recommended on nonemergent basis  Chronic anemia -Baseline hemoglobin 10-11, currently 11.8 (with no intervention) -Continue to monitor CBC, currently not bleeding  Code Status: Full  Family Communication: Family at bedside  Disposition Plan: Admitted. Continue to monitor drain output  Time Spent in minutes   35 minutes  Procedures  IR placed perc drain 05/16/2015  Consults   Urology Interventional radiology ID, via phone  DVT Prophylaxis  heparin  Lab Results  Component Value Date   PLT 694* 05/19/2015    Medications  Scheduled Meds: . cefTRIAXone (ROCEPHIN)  IV  2 g Intravenous Q24H  . heparin  5,000 Units Subcutaneous 3 times per day  . pneumococcal 23 valent vaccine  0.5 mL Intramuscular Tomorrow-1000  . sodium chloride  3 mL Intravenous Q12H   Continuous Infusions:   PRN Meds:.alum & mag hydroxide-simeth, ketorolac, ondansetron **OR** ondansetron (ZOFRAN) IV, oxyCODONE, senna-docusate, zolpidem  Antibiotics    Anti-infectives    Start     Dose/Rate Route Frequency Ordered Stop   05/16/15 1000  cefTRIAXone (ROCEPHIN) 2 g in dextrose 5 % 50 mL IVPB - Premix     2  g 100 mL/hr over 30 Minutes Intravenous Every 24 hours 05/15/15 1500     05/15/15 1600  cefTRIAXone (ROCEPHIN) 1 g in dextrose 5 % 50 mL IVPB - Premix  Status:  Discontinued    Comments:  Want a total of 2 grams daily.   1 g 100 mL/hr over 30 Minutes Intravenous  Once 05/15/15 1500 05/15/15 1550   05/15/15 1515  cefTRIAXone (ROCEPHIN) 1 g in dextrose 5 % 50 mL IVPB - Premix  Status:  Discontinued     1 g 100 mL/hr over 30 Minutes Intravenous  Once 05/15/15 1504 05/15/15 1623   05/15/15 1300  cefTRIAXone (ROCEPHIN) 1 g in dextrose 5 % 50 mL IVPB     1 g 100 mL/hr over 30 Minutes  Intravenous  Once 05/15/15 1245 05/15/15 1414        Subjective:   [name] has no new complaints. No acute issues overnight.  Objective:   Filed Vitals:   05/20/15 2224 05/21/15 0502 05/21/15 0900 05/21/15 1748  BP: 109/68 112/67 91/61 101/58  Pulse: 75 88 62 60  Temp: 98.2 F (36.8 C) 98 F (36.7 C) 98 F (36.7 C) 97.8 F (36.6 C)  TempSrc: Oral Oral Oral Oral  Resp: 18 16 16 18   Height:      Weight:      SpO2: 100% 100% 100% 100%    Wt Readings from Last 3 Encounters:  05/18/15 65.499 kg (144 lb 6.4 oz)  03/11/15 74.844 kg (165 lb)  07/29/14 65.772 kg (145 lb)     Intake/Output Summary (Last 24 hours) at 05/21/15 1749 Last data filed at 05/21/15 0900  Gross per 24 hour  Intake    740 ml  Output     30 ml  Net    710 ml    Exam  General: Well developed, well nourished, mild distress  HEENT: NCAT, mucous membranes moist.   Cardiovascular: S1 S2 auscultated, RRR, no murmurs  Respiratory: Clear to auscultation, no wheezes  Abdomen: Soft, right sided TTP, nondistended, + bowel sounds. Drain in place.  Extremities: warm dry without cyanosis clubbing or edema  Neuro: AAOx3,nonfocal  Data Review   Micro Results Recent Results (from the past 240 hour(s))  Wet prep, genital     Status: Abnormal   Collection Time: 05/15/15 10:29 AM  Result Value Ref Range Status   Yeast Wet Prep HPF POC NONE SEEN NONE SEEN Final   Trich, Wet Prep NONE SEEN NONE SEEN Final   Clue Cells Wet Prep HPF POC NONE SEEN NONE SEEN Final   WBC, Wet Prep HPF POC FEW (A) NONE SEEN Final  Urine culture     Status: None   Collection Time: 05/15/15 10:52 AM  Result Value Ref Range Status   Specimen Description URINE, RANDOM  Final   Special Requests NONE  Final   Culture NO GROWTH 1 DAY  Final   Report Status 05/16/2015 FINAL  Final  Blood culture (routine x 2)     Status: None   Collection Time: 05/15/15  1:28 PM  Result Value Ref Range Status   Specimen Description  BLOOD LEFT FOREARM  Final   Special Requests BOTTLES DRAWN AEROBIC AND ANAEROBIC 3CC  Final   Culture NO GROWTH 5 DAYS  Final   Report Status 05/20/2015 FINAL  Final  Culture, blood (single)     Status: None   Collection Time: 05/15/15  4:38 PM  Result Value Ref Range Status   Specimen Description BLOOD  RIGHT ARM  Final   Special Requests IN PEDIATRIC BOTTLE 3CC  Final   Culture NO GROWTH 5 DAYS  Final   Report Status 05/20/2015 FINAL  Final  Culture, routine-abscess     Status: None   Collection Time: 05/16/15  4:00 PM  Result Value Ref Range Status   Specimen Description ABSCESS  Final   Special Requests KIDNEY  Final   Gram Stain   Final    ABUNDANT WBC PRESENT, PREDOMINANTLY PMN NO SQUAMOUS EPITHELIAL CELLS SEEN MODERATE GRAM POSITIVE COCCI IN CLUSTERS Performed at [facility]    Culture   Final    MODERATE STAPHYLOCOCCUS AUREUS Note: RIFAMPIN AND GENTAMICIN SHOULD NOT BE USED AS SINGLE DRUGS FOR TREATMENT OF STAPH INFECTIONS. Performed at [facility]    Report Status 05/19/2015 FINAL  Final   Organism ID, Bacteria STAPHYLOCOCCUS AUREUS  Final      Susceptibility   Staphylococcus aureus - MIC*    CLINDAMYCIN <=0.25 SENSITIVE Sensitive     ERYTHROMYCIN 0.5 SENSITIVE Sensitive     GENTAMICIN <=0.5 SENSITIVE Sensitive     LEVOFLOXACIN 0.25 SENSITIVE Sensitive     OXACILLIN 0.5 SENSITIVE Sensitive     RIFAMPIN <=0.5 SENSITIVE Sensitive     TRIMETH/SULFA <=10 SENSITIVE Sensitive     VANCOMYCIN <=0.5 SENSITIVE Sensitive     TETRACYCLINE >=16 RESISTANT Resistant     MOXIFLOXACIN <=0.25 SENSITIVE Sensitive     * MODERATE STAPHYLOCOCCUS AUREUS    Radiology Reports Ct Abdomen Pelvis W Contrast  05/15/2015   CLINICAL DATA:  Acute right lower quadrant abdominal pain.  EXAM: CT ABDOMEN AND PELVIS WITH CONTRAST  TECHNIQUE: Multidetector CT imaging of the abdomen and pelvis was performed using the standard protocol following bolus administration of intravenous  contrast.  CONTRAST:  182mL OMNIPAQUE IOHEXOL 300 MG/ML  SOLN  COMPARISON:  CT scan of June 14, 2005.  FINDINGS: Mild right posterior basilar opacity is noted concerning for pneumonia or atelectasis with associated small loculated effusion. 16 mm lucency is noted in the medial portion the right iliac wing which is increased compared to prior exam.  Status post cholecystectomy. The liver, spleen and pancreas appear normal. Adrenal glands and left kidney appear normal. Multiple loculated fluid collections are seen in the right pararenal space consistent with multiple abscesses. Minimal right hydroureteronephrosis is noted without obstructing calculus. Urinary bladder appears normal. There is no evidence of bowel obstruction uterus appears normal. Ovaries appear normal. No significant adenopathy is noted.  IMPRESSION: Minimal right hydroureteronephrosis is noted without obstructing calculus. Multiple small loculated fluid collections are noted around the upper and middle pole of the right kidney most consistent with abscesses secondary to pyelonephritis. Right posterior basilar opacity is noted as well consistent with atelectasis or possibly pneumonia with small loculated pleural effusion.  16 mm lucency seen superiorly in right iliac wing which is more prominent compared to prior exam. This may simply represent benign abnormality, but further evaluation with bone scan or MRI is recommended on nonemergent basis.   Electronically Signed   By: Marijo Conception, M.D.   On: 05/15/2015 12:41   Ct Image Guided Drainage By Percutaneous Catheter  05/16/2015   INDICATION: History of right-sided perinephric abscess. Please perform CT-guided percutaneous drainage catheter placement for therapeutic purposes.  EXAM: CT IMAGE GUIDED DRAINAGE BY PERCUTANEOUS CATHETER  COMPARISON:  CT abdomen and pelvis - 05/15/2015  MEDICATIONS: The patient is currently admitted to the hospital and receiving intravenous antibiotics. The  antibiotics were administered within an  appropriate time frame prior to the initiation of the procedure.  ANESTHESIA/SEDATION: Fentanyl 150 mcg IV; Versed 5 mg IV  Total Moderate Sedation time  15 minutes  CONTRAST:  None  COMPLICATIONS: None immediate  PROCEDURE: Informed written consent was obtained from the patient after a discussion of the risks, benefits and alternatives to treatment. The patient was placed prone on the CT gantry and a pre procedural CT was performed re-demonstrating the known abscess/fluid collection within the right perinephric space with dominant ill-defined component measuring approximately 4.7 x 1.5 cm (image 30, series 2). The procedure was planned. A timeout was performed prior to the initiation of the procedure.  The skin overlying the midline of the right back was prepped and draped in the usual sterile fashion. The overlying soft tissues were anesthetized with 1% lidocaine with epinephrine. Appropriate trajectory was planned with the use of a 22 gauge spinal needle. An 18 gauge trocar needle was advanced into the abscess/fluid collection and a short Amplatz super stiff wire was coiled within the collection. Appropriate positioning was confirmed with a limited CT scan. The tract was serially dilated allowing placement of a 10 [place] all-purpose drainage catheter. Appropriate positioning was confirmed with a limited postprocedural CT scan.  Approximately 10 Ml of purulent fluid was aspirated. The tube was connected to a JP bulb and sutured in place. A dressing was placed. The patient tolerated the procedure well without immediate post procedural complication.  IMPRESSION: Successful CT guided placement of a 10 French all purpose drain catheter into the right perinephric space with aspiration of 10 mL of purulent fluid. Samples were sent to the laboratory as requested by the ordering clinical team.   Electronically Signed   By: Sandi Mariscal M.D.   On: 05/16/2015 14:20    CBC  Recent  Labs Lab 05/15/15 0950 05/16/15 0530 05/17/15 0609 05/18/15 0505 05/19/15 0751  WBC 13.1* 15.1* 12.8* 9.8 8.1  HGB 11.1* 10.2* 9.8* 10.6* 11.8*  HCT 33.6* 32.0* 30.3* 32.9* 35.8*  PLT 790* 752* 688* 734* 694*  MCV 89.6 90.7 90.2 90.1 90.2  MCH 29.6 28.9 29.2 29.0 29.7  MCHC 33.0 31.9 32.3 32.2 33.0  RDW 15.1 15.6* 15.4 15.4 15.3  LYMPHSABS 3.5  --   --   --   --   MONOABS 0.6  --   --   --   --   EOSABS 0.3  --   --   --   --   BASOSABS 0.0  --   --   --   --     Chemistries   Recent Labs Lab 05/15/15 0950 05/16/15 0530 05/17/15 0609 05/18/15 0505 05/19/15 0751  NA 139 140 138 139 140  K 3.4* 3.9 3.5 3.5 3.8  CL 101 110 106 109 107  CO2 29 26 23 25 24   GLUCOSE 86 91 89 88 84  BUN <5* <5* <5* <5* 6  CREATININE 0.90 0.56 0.62 0.60 0.57  CALCIUM 8.4* 7.9* 8.1* 8.3* 8.5*  AST 29  --   --   --   --   ALT 23  --   --   --   --   ALKPHOS 65  --   --   --   --   BILITOT 1.1  --   --   --   --    ------------------------------------------------------------------------------------------------------------------ estimated creatinine clearance is 89.7 mL/min (by C-G formula based on Cr of 0.57). ------------------------------------------------------------------------------------------------------------------ No results for input(s): HGBA1C in the last 72  hours. ------------------------------------------------------------------------------------------------------------------ No results for input(s): CHOL, HDL, LDLCALC, TRIG, CHOLHDL, LDLDIRECT in the last 72 hours. ------------------------------------------------------------------------------------------------------------------ No results for input(s): TSH, T4TOTAL, T3FREE, THYROIDAB in the last 72 hours.  Invalid input(s): FREET3 ------------------------------------------------------------------------------------------------------------------ No results for input(s): VITAMINB12, FOLATE, FERRITIN, TIBC, IRON, RETICCTPCT in the  last 72 hours.  Coagulation profile  Recent Labs Lab 05/16/15 1820  INR 1.41    No results for input(s): DDIMER in the last 72 hours.  Cardiac Enzymes No results for input(s): CKMB, TROPONINI, MYOGLOBIN in the last 168 hours.  Invalid input(s): CK ------------------------------------------------------------------------------------------------------------------ Invalid input(s): POCBNP    Cecille Mcclusky D.O. on 05/21/2015 at 5:49 PM  Between 7am to 7pm - Pager - [phone]  After 7pm go to [email or web address] - password [email or web address]  And look for the night coverage person covering for me after hours  [facility] Office  [phone]

## 2015-05-21 NOTE — Progress Notes (Signed)
Referring Physician(s): Dr. Gaynelle Arabian  Chief Complaint: Right perinephric abscess s/p perc drain  Subjective: Patient feeling pretty good. Hopeful to go home soon. Denies much pain  Allergies: Tylenol  Medications:  Current facility-administered medications:  .  alum & mag hydroxide-simeth (MAALOX/MYLANTA) 200-200-20 MG/5ML suspension 30 mL, 30 mL, Oral, Q6H PRN, Bobby Rumpf York, PA-C .  cefTRIAXone (ROCEPHIN) 2 g in dextrose 5 % 50 mL IVPB - Premix, 2 g, Intravenous, Q24H, Marianne L York, PA-C, 2 g at 05/21/15 0915 .  heparin injection 5,000 Units, 5,000 Units, Subcutaneous, 3 times per day, Melton Alar, PA-C, 5,000 Units at 05/20/15 1410 .  ketorolac (TORADOL) 15 MG/ML injection 15 mg, 15 mg, Intravenous, Q6H PRN, Velvet Bathe, MD, 15 mg at 05/21/15 0740 .  ondansetron (ZOFRAN) tablet 4 mg, 4 mg, Oral, Q6H PRN **OR** ondansetron (ZOFRAN) injection 4 mg, 4 mg, Intravenous, Q6H PRN, Bobby Rumpf York, PA-C .  oxyCODONE (Oxy IR/ROXICODONE) immediate release tablet 5-10 mg, 5-10 mg, Oral, Q4H PRN, Velvet Bathe, MD, 10 mg at 05/21/15 0915 .  pneumococcal 23 valent vaccine (PNU-IMMUNE) injection 0.5 mL, 0.5 mL, Intramuscular, Tomorrow-1000, Theodis Blaze, MD .  senna-docusate (Senokot-S) tablet 1 tablet, 1 tablet, Oral, QHS PRN, Bobby Rumpf York, PA-C .  sodium chloride 0.9 % injection 3 mL, 3 mL, Intravenous, Q12H, Marianne L York, PA-C, 3 mL at 05/21/15 0915 .  zolpidem (AMBIEN) tablet 5 mg, 5 mg, Oral, QHS PRN, Gardiner Barefoot, NP, 5 mg at 05/21/15 0127  Vital Signs: BP 91/61 mmHg  Pulse 62  Temp(Src) 98 F (36.7 C) (Oral)  Resp 16  Ht 5\' 2"  (1.575 m)  Wt 144 lb 6.4 oz (65.499 kg)  BMI 26.40 kg/m2  SpO2 100%  LMP 04/27/2015  Physical Exam General: A&Ox3, NAD, eating lunch Abd: Soft, NT, ND, right flank drain intact,  40cc output recorded including 54mL flush/day, still cloudy yellow.   Labs:  CBC:  Recent Labs  05/16/15 0530 05/17/15 0609  05/18/15 0505 05/19/15 0751  WBC 15.1* 12.8* 9.8 8.1  HGB 10.2* 9.8* 10.6* 11.8*  HCT 32.0* 30.3* 32.9* 35.8*  PLT 752* 688* 734* 694*    COAGS:  Recent Labs  05/16/15 1820  INR 1.41  APTT 33    BMP:  Recent Labs  05/16/15 0530 05/17/15 0609 05/18/15 0505 05/19/15 0751  NA 140 138 139 140  K 3.9 3.5 3.5 3.8  CL 110 106 109 107  CO2 26 23 25 24   GLUCOSE 91 89 88 84  BUN <5* <5* <5* 6  CALCIUM 7.9* 8.1* 8.3* 8.5*  CREATININE 0.56 0.62 0.60 0.57  GFRNONAA >60 >60 >60 >60  GFRAA >60 >60 >60 >60    LIVER FUNCTION TESTS:  Recent Labs  05/15/15 0950  BILITOT 1.1  AST 29  ALT 23  ALKPHOS 65  PROT 6.6  ALBUMIN 2.1*    Assessment and Plan: Right perinephric abscess S/p perc drain placed 7/30, wbc trending down-now wnl Cx staph aureus  Follow output and flush catheter. Output fluctuating but fairly low. Ideally would like to have pt come to clinic next week for repeat CT and possible drain removal, but due to social situation, pt may not be able to keep outpt follow up. Will plan repeat CT abd/pelvis with IV contrast tomorrow to re-eval abscess. If resolved, can likely remove drain and discharge tomorrow.   SignedAscencion Dike 05/21/2015, 11:15 AM   I spent a total of 15 Minutes in face to face in  clinical consultation/evaluation, greater than 50% of which was counseling/coordinating care for right perinephric abscess

## 2015-05-22 ENCOUNTER — Inpatient Hospital Stay (HOSPITAL_COMMUNITY): Payer: Medicaid Other

## 2015-05-22 MED ORDER — MELOXICAM 7.5 MG PO TABS: 7.5000 mg | ORAL_TABLET | Freq: Every day | ORAL | Status: DC

## 2015-05-22 MED ORDER — IOHEXOL 300 MG/ML  SOLN
80.0000 mL | Freq: Once | INTRAMUSCULAR | Status: AC | PRN
  Administered 2015-05-22: 80 mL via INTRAVENOUS

## 2015-05-22 MED ORDER — CLINDAMYCIN HCL 300 MG PO CAPS: 300.0000 mg | ORAL_CAPSULE | Freq: Three times a day (TID) | ORAL | Status: DC

## 2015-05-22 NOTE — Discharge Summary (Signed)
Physician Discharge Summary  Feliz Herard ZOX:[id number] DOB: 12-04-1980 DOA: 05/15/2015  PCP: No PCP Per Patient  Admit date: 05/15/2015 Discharge date: 05/22/2015  Time spent: > 35 minutes  Recommendations for Outpatient Follow-up:  1. Please decide whether or not patient will need a longer course of antibiotics, will have been treated for 14 days total 2. 16 mm lucency seen superiorly in right iliac wing - more prominent than prior exam. -Bone scan or MRI is recommended on nonemergent basis  Discharge Diagnoses:  Principal Problem:   Renal abscess Active Problems:   Tobacco abuse   Thrombocytosis   Hypotension   Narcotic abuse in remission   Abnormal CT scan, pelvis   Renal abscess, right   Discharge Condition: stable  Diet recommendation: regular  Filed Weights   05/17/15 2057 05/18/15 2152 05/21/15 2016  Weight: 66.543 kg (146 lb 11.2 oz) 65.499 kg (144 lb 6.4 oz) 65.463 kg (144 lb 5.1 oz)    History of present illness:  34 year old female history of seizures, chronic back pain, tobacco use and narcotic use presented with abdominal pain and back pain for one week. CT scan showed multiple renal abscesses.  Hospital Course:  Renal abscesses / pyelonephritis -CT scan shows multiple small loculated fluid collections around the upper and middle pole of the right kidney most consistent with abscesses secondary to pyelonephritis. Repeat CT scan showed resolution of abscess -Urology consulted and appreciated and recommended IR for drain -Patient s/p right perinephric abscess drainage on 05/16/2015. Pulled on 05/22/15 given improvement in condition. -Blood cultures show no growth to date, urine culture shows no growth -Perinephric fluid culture: moderate - staph aureus will discharge on clindamycin with plans for patient to f/u with ID after hospital d/c -Leukocytosis resolved  Hypotension -Resolved with IVF and improvement in condition with  antibiotics  Thrombocytosis -likely acute phase reactant to above -Platelets 694  Narcotic abuse/Polysubstance abuse -patient endorsed last use before going to rehab in [facility], fentanyl 100 mcg in ED is not controlling pain -Patient denies any current drug use however tox screen was positive for benzos, opiates, cocaine, THC -Will not d/c on opiods. Will d/c with meloxicam given history.  Abnormal CT scan, pelvis -16 mm lucency seen superiorly in right iliac wing - more prominent than prior exam. -Bone scan or MRI is recommended on nonemergent basis  Chronic anemia -Baseline hemoglobin 10-11, currently 11.8 (with no intervention)  Procedures:  Please see above  Consultations:  none  Discharge Exam: Filed Vitals:   05/22/15 1641  BP: 106/71  Pulse: 84  Temp: 98 F (36.7 C)  Resp: 17    General: pt in nad, alert and awake Cardiovascular: rrr, no mrg Respiratory: cta bl, no wheezes  Discharge Instructions   Discharge Instructions    Call MD for:  redness, tenderness, or signs of infection (pain, swelling, redness, odor or green/yellow discharge around incision site)    Complete by:  As directed      Call MD for:  severe uncontrolled pain    Complete by:  As directed      Call MD for:  temperature >100.4    Complete by:  As directed      Diet - low sodium heart healthy    Complete by:  As directed      Discharge instructions    Complete by:  As directed   Follow up with your primary care physician or infectious disease doctor in 1 week after hospital discharge.  Increase activity slowly    Complete by:  As directed           Current Discharge Medication List    START taking these medications   Details  clindamycin (CLEOCIN) 300 MG capsule Take 1 capsule (300 mg total) by mouth 3 (three) times daily. Qty: 21 capsule, Refills: 0    meloxicam (MOBIC) 7.5 MG tablet Take 1 tablet (7.5 mg total) by mouth daily. Qty: 15 tablet, Refills: 0       STOP taking these medications     naproxen sodium (ANAPROX) 220 MG tablet        Allergies  Allergen Reactions  . Tylenol [Acetaminophen] Other (See Comments)    Burns stomach      The results of significant diagnostics from this hospitalization (including imaging, microbiology, ancillary and laboratory) are listed below for reference.    Significant Diagnostic Studies: Ct Abdomen Pelvis W Contrast  05/22/2015   CLINICAL DATA:  Status post percutaneous drainage of right perirenal retroperitoneal abscess on 05/16/2015.  EXAM: CT ABDOMEN AND PELVIS WITH CONTRAST  TECHNIQUE: Multidetector CT imaging of the abdomen and pelvis was performed using the standard protocol following bolus administration of intravenous contrast.  CONTRAST:  31mL OMNIPAQUE IOHEXOL 300 MG/ML  SOLN  COMPARISON:  05/15/2015  FINDINGS: In the location of the right percutaneous drainage catheter just posterior to the right mid kidney, dominant abscess has resolved. Adjacent pockets are also greatly improved with a small lateral pocket adjacent to the upper pole measuring 12 mm and a medial pocket adjacent to the mid kidney measuring 12 mm. Both of these areas were considerably larger on the original scan.  No evidence of hydronephrosis. Other solid organs remain unremarkable. No bowel obstruction or ileus identified. No new abscess identified.  IMPRESSION: Near complete resolution of right perirenal abscesses. Dominant abscess at the level of the percutaneous drainage catheter has completely resolved. Small adjacent pockets of fluid show significant improvement and likely are not in communication currently with the indwelling drain.   Electronically Signed   By: Aletta Edouard M.D.   On: 05/22/2015 16:23   Ct Abdomen Pelvis W Contrast  05/15/2015   CLINICAL DATA:  Acute right lower quadrant abdominal pain.  EXAM: CT ABDOMEN AND PELVIS WITH CONTRAST  TECHNIQUE: Multidetector CT imaging of the abdomen and pelvis was performed  using the standard protocol following bolus administration of intravenous contrast.  CONTRAST:  132mL OMNIPAQUE IOHEXOL 300 MG/ML  SOLN  COMPARISON:  CT scan of June 14, 2005.  FINDINGS: Mild right posterior basilar opacity is noted concerning for pneumonia or atelectasis with associated small loculated effusion. 16 mm lucency is noted in the medial portion the right iliac wing which is increased compared to prior exam.  Status post cholecystectomy. The liver, spleen and pancreas appear normal. Adrenal glands and left kidney appear normal. Multiple loculated fluid collections are seen in the right pararenal space consistent with multiple abscesses. Minimal right hydroureteronephrosis is noted without obstructing calculus. Urinary bladder appears normal. There is no evidence of bowel obstruction uterus appears normal. Ovaries appear normal. No significant adenopathy is noted.  IMPRESSION: Minimal right hydroureteronephrosis is noted without obstructing calculus. Multiple small loculated fluid collections are noted around the upper and middle pole of the right kidney most consistent with abscesses secondary to pyelonephritis. Right posterior basilar opacity is noted as well consistent with atelectasis or possibly pneumonia with small loculated pleural effusion.  16 mm lucency seen superiorly in right iliac wing which is more prominent  compared to prior exam. This may simply represent benign abnormality, but further evaluation with bone scan or MRI is recommended on nonemergent basis.   Electronically Signed   By: Marijo Conception, M.D.   On: 05/15/2015 12:41   Ct Image Guided Drainage By Percutaneous Catheter  05/16/2015   INDICATION: History of right-sided perinephric abscess. Please perform CT-guided percutaneous drainage catheter placement for therapeutic purposes.  EXAM: CT IMAGE GUIDED DRAINAGE BY PERCUTANEOUS CATHETER  COMPARISON:  CT abdomen and pelvis - 05/15/2015  MEDICATIONS: The patient is currently  admitted to the hospital and receiving intravenous antibiotics. The antibiotics were administered within an appropriate time frame prior to the initiation of the procedure.  ANESTHESIA/SEDATION: Fentanyl 150 mcg IV; Versed 5 mg IV  Total Moderate Sedation time  15 minutes  CONTRAST:  None  COMPLICATIONS: None immediate  PROCEDURE: Informed written consent was obtained from the patient after a discussion of the risks, benefits and alternatives to treatment. The patient was placed prone on the CT gantry and a pre procedural CT was performed re-demonstrating the known abscess/fluid collection within the right perinephric space with dominant ill-defined component measuring approximately 4.7 x 1.5 cm (image 30, series 2). The procedure was planned. A timeout was performed prior to the initiation of the procedure.  The skin overlying the midline of the right back was prepped and draped in the usual sterile fashion. The overlying soft tissues were anesthetized with 1% lidocaine with epinephrine. Appropriate trajectory was planned with the use of a 22 gauge spinal needle. An 18 gauge trocar needle was advanced into the abscess/fluid collection and a short Amplatz super stiff wire was coiled within the collection. Appropriate positioning was confirmed with a limited CT scan. The tract was serially dilated allowing placement of a 10 [place] all-purpose drainage catheter. Appropriate positioning was confirmed with a limited postprocedural CT scan.  Approximately 10 Ml of purulent fluid was aspirated. The tube was connected to a JP bulb and sutured in place. A dressing was placed. The patient tolerated the procedure well without immediate post procedural complication.  IMPRESSION: Successful CT guided placement of a 10 French all purpose drain catheter into the right perinephric space with aspiration of 10 mL of purulent fluid. Samples were sent to the laboratory as requested by the ordering clinical team.   Electronically  Signed   By: Sandi Mariscal M.D.   On: 05/16/2015 14:20    Microbiology: Recent Results (from the past 240 hour(s))  Wet prep, genital     Status: Abnormal   Collection Time: 05/15/15 10:29 AM  Result Value Ref Range Status   Yeast Wet Prep HPF POC NONE SEEN NONE SEEN Final   Trich, Wet Prep NONE SEEN NONE SEEN Final   Clue Cells Wet Prep HPF POC NONE SEEN NONE SEEN Final   WBC, Wet Prep HPF POC FEW (A) NONE SEEN Final  Urine culture     Status: None   Collection Time: 05/15/15 10:52 AM  Result Value Ref Range Status   Specimen Description URINE, RANDOM  Final   Special Requests NONE  Final   Culture NO GROWTH 1 DAY  Final   Report Status 05/16/2015 FINAL  Final  Blood culture (routine x 2)     Status: None   Collection Time: 05/15/15  1:28 PM  Result Value Ref Range Status   Specimen Description BLOOD LEFT FOREARM  Final   Special Requests BOTTLES DRAWN AEROBIC AND ANAEROBIC 3CC  Final   Culture NO GROWTH 5  DAYS  Final   Report Status 05/20/2015 FINAL  Final  Culture, blood (single)     Status: None   Collection Time: 05/15/15  4:38 PM  Result Value Ref Range Status   Specimen Description BLOOD RIGHT ARM  Final   Special Requests IN PEDIATRIC BOTTLE 3CC  Final   Culture NO GROWTH 5 DAYS  Final   Report Status 05/20/2015 FINAL  Final  Culture, routine-abscess     Status: None   Collection Time: 05/16/15  4:00 PM  Result Value Ref Range Status   Specimen Description ABSCESS  Final   Special Requests KIDNEY  Final   Gram Stain   Final    ABUNDANT WBC PRESENT, PREDOMINANTLY PMN NO SQUAMOUS EPITHELIAL CELLS SEEN MODERATE GRAM POSITIVE COCCI IN CLUSTERS Performed at [facility]    Culture   Final    MODERATE STAPHYLOCOCCUS AUREUS Note: RIFAMPIN AND GENTAMICIN SHOULD NOT BE USED AS SINGLE DRUGS FOR TREATMENT OF STAPH INFECTIONS. Performed at [facility]    Report Status 05/19/2015 FINAL  Final   Organism ID, Bacteria STAPHYLOCOCCUS AUREUS  Final       Susceptibility   Staphylococcus aureus - MIC*    CLINDAMYCIN <=0.25 SENSITIVE Sensitive     ERYTHROMYCIN 0.5 SENSITIVE Sensitive     GENTAMICIN <=0.5 SENSITIVE Sensitive     LEVOFLOXACIN 0.25 SENSITIVE Sensitive     OXACILLIN 0.5 SENSITIVE Sensitive     RIFAMPIN <=0.5 SENSITIVE Sensitive     TRIMETH/SULFA <=10 SENSITIVE Sensitive     VANCOMYCIN <=0.5 SENSITIVE Sensitive     TETRACYCLINE >=16 RESISTANT Resistant     MOXIFLOXACIN <=0.25 SENSITIVE Sensitive     * MODERATE STAPHYLOCOCCUS AUREUS     Labs: Basic Metabolic Panel:  Recent Labs Lab 05/16/15 0530 05/17/15 0609 05/18/15 0505 05/19/15 0751  NA 140 138 139 140  K 3.9 3.5 3.5 3.8  CL 110 106 109 107  CO2 26 23 25 24   GLUCOSE 91 89 88 84  BUN <5* <5* <5* 6  CREATININE 0.56 0.62 0.60 0.57  CALCIUM 7.9* 8.1* 8.3* 8.5*   Liver Function Tests: No results for input(s): AST, ALT, ALKPHOS, BILITOT, PROT, ALBUMIN in the last 168 hours. No results for input(s): LIPASE, AMYLASE in the last 168 hours. No results for input(s): AMMONIA in the last 168 hours. CBC:  Recent Labs Lab 05/16/15 0530 05/17/15 0609 05/18/15 0505 05/19/15 0751  WBC 15.1* 12.8* 9.8 8.1  HGB 10.2* 9.8* 10.6* 11.8*  HCT 32.0* 30.3* 32.9* 35.8*  MCV 90.7 90.2 90.1 90.2  PLT 752* 688* 734* 694*   Cardiac Enzymes: No results for input(s): CKTOTAL, CKMB, CKMBINDEX, TROPONINI in the last 168 hours. BNP: BNP (last 3 results) No results for input(s): BNP in the last 8760 hours.  ProBNP (last 3 results) No results for input(s): PROBNP in the last 8760 hours.  CBG: No results for input(s): GLUCAP in the last 168 hours.     Signed:  Velvet Bathe  [facility] 05/22/2015, 5:54 PM

## 2015-05-22 NOTE — Progress Notes (Signed)
05/22/2015 6:17 PM  This RN was at the front desk when the patient walked past with her belongings towards the elevator.  At this point the patient had discharge orders and prescriptions but I had not went over them with her yet.  I asked the patient where she was going since I hadn't gone over her instructions yet.  She was on the phone at the time and turned around and stated to me that her ride was right downstairs and she was going to go drop her belongings off in the car and come right back up for her instructions.  I encouraged the patient to wait since I had the paperwork in my hands and could go over everything immediately.  She hopped on the elevator and stated she would be right back.  IV still intact.  Approximately 30 minutes went by and the patient never returned.  Security was paged to search for the patient, however, she never turned up.  All belongings were gone from the room.  Pt left without discharge paperwork and without her prescriptions with IV still in place.  Dr. Wendee Beavers notified of events.    Princella Pellegrini

## 2015-05-22 NOTE — Care Management Note (Signed)
Case Management Note  Patient Details  Name: [name] MRN: [id number] Date of Birth: 10/13/1981  Subjective/Objective:                 CM has followed for entire admission for progression and d/c needs.   Action/Plan: 05/21/2015 Met with pt and MATCH letter given, this was explained to pt and family member in detail, and pt was able to verbalize understanding and selected pharmacy to be used. She will await prescriptions. This CM reminded the MD that the pt must have prescriptions and that he should not fax in the prescriptions. Pt is ambulatory and very anxious to go home.   Expected Discharge Date:     05/22/2015             Expected Discharge Plan:  Home/Self Care  In-House Referral:  NA  Discharge planning Services  CM Consult, MATCH Program  Post Acute Care Choice:  NA Choice offered to:  NA  DME Arranged:    DME Agency:     HH Arranged:    HH Agency:     Status of Service:  Completed, signed off  Medicare Important Message Given:    Date Medicare IM Given:    Medicare IM give by:    Date Additional Medicare IM Given:    Additional Medicare Important Message give by:     If discussed at Long Length of Stay Meetings, dates discussed:    Additional Comments:  Royal, Cheryl U, RN 05/22/2015, 11:23 AM  

## 2015-05-22 NOTE — ED Provider Notes (Signed)
Patient seen and evaluated. Discussed with PA. Reports abdominal pain. CT scan shows perinephric abscess. PA as consulted medicine and urology. Agree with assessment and plan.  Tanna Furry, MD 05/22/15 [phone]

## 2015-06-02 ENCOUNTER — Encounter (HOSPITAL_COMMUNITY): Payer: Self-pay | Admitting: Emergency Medicine

## 2015-06-02 ENCOUNTER — Emergency Department (HOSPITAL_COMMUNITY)
Admission: EM | Admit: 2015-06-02 | Discharge: 2015-06-02 | Disposition: A | Discharge status: Home / Self Care | Payer: Medicaid Other | Attending: Emergency Medicine | Admitting: Emergency Medicine

## 2015-06-02 ENCOUNTER — Emergency Department (HOSPITAL_COMMUNITY): Payer: Medicaid Other

## 2015-06-02 DIAGNOSIS — Z9851 Tubal ligation status: Secondary | ICD-10-CM | POA: Insufficient documentation

## 2015-06-02 DIAGNOSIS — G8929 Other chronic pain: Secondary | ICD-10-CM | POA: Insufficient documentation

## 2015-06-02 DIAGNOSIS — N151 Renal and perinephric abscess: Secondary | ICD-10-CM

## 2015-06-02 DIAGNOSIS — M545 Low back pain: Secondary | ICD-10-CM

## 2015-06-02 DIAGNOSIS — Z720 Tobacco use: Secondary | ICD-10-CM | POA: Insufficient documentation

## 2015-06-02 DIAGNOSIS — Z3202 Encounter for pregnancy test, result negative: Secondary | ICD-10-CM | POA: Insufficient documentation

## 2015-06-02 LAB — CBC WITH DIFFERENTIAL/PLATELET
Basophils Absolute: 0 10*3/uL (ref 0.0–0.1)
Basophils Relative: 0 % (ref 0–1)
Eosinophils Absolute: 0.5 10*3/uL (ref 0.0–0.7)
Eosinophils Relative: 6 % — ABNORMAL HIGH (ref 0–5)
HCT: 42.4 % (ref 36.0–46.0)
Hemoglobin: 13.7 g/dL (ref 12.0–15.0)
Lymphocytes Relative: 41 % (ref 12–46)
Lymphs Abs: 3.3 10*3/uL (ref 0.7–4.0)
MCH: 30 pg (ref 26.0–34.0)
MCHC: 32.3 g/dL (ref 30.0–36.0)
MCV: 92.8 fL (ref 78.0–100.0)
Monocytes Absolute: 0.5 10*3/uL (ref 0.1–1.0)
Monocytes Relative: 6 % (ref 3–12)
Neutro Abs: 3.7 10*3/uL (ref 1.7–7.7)
Neutrophils Relative %: 47 % (ref 43–77)
Platelets: 310 10*3/uL (ref 150–400)
RBC: 4.57 MIL/uL (ref 3.87–5.11)
RDW: 15.3 % (ref 11.5–15.5)
WBC: 8 10*3/uL (ref 4.0–10.5)

## 2015-06-02 LAB — URINALYSIS, ROUTINE W REFLEX MICROSCOPIC
Bilirubin Urine: NEGATIVE
Glucose, UA: NEGATIVE mg/dL
Hgb urine dipstick: NEGATIVE
Ketones, ur: NEGATIVE mg/dL
Nitrite: NEGATIVE
Protein, ur: NEGATIVE mg/dL
Specific Gravity, Urine: 1.006 (ref 1.005–1.030)
Urobilinogen, UA: 0.2 mg/dL (ref 0.0–1.0)
pH: 7 (ref 5.0–8.0)

## 2015-06-02 LAB — BASIC METABOLIC PANEL
Anion gap: 8 (ref 5–15)
BUN: <5 mg/dL — ABNORMAL LOW (ref 6–20)
CO2: 25 mmol/L (ref 22–32)
Calcium: 8.9 mg/dL (ref 8.9–10.3)
Chloride: 103 mmol/L (ref 101–111)
Creatinine, Ser: 0.48 mg/dL (ref 0.44–1.00)
GFR calc Af Amer: >60 mL/min (ref >60)
GFR calc non Af Amer: >60 mL/min (ref >60)
Glucose, Bld: 84 mg/dL (ref 65–99)
Potassium: 4 mmol/L (ref 3.5–5.1)
Sodium: 136 mmol/L (ref 135–145)

## 2015-06-02 LAB — POC URINE PREG, ED: Preg Test, Ur: NEGATIVE

## 2015-06-02 LAB — URINE MICROSCOPIC-ADD ON

## 2015-06-02 MED ORDER — IOHEXOL 300 MG/ML  SOLN
50.0000 mL | Freq: Once | INTRAMUSCULAR | Status: DC | PRN
  Administered 2015-06-02: 50 mL via ORAL
  Filled 2015-06-02: qty 50

## 2015-06-02 MED ORDER — IOHEXOL 300 MG/ML  SOLN
100.0000 mL | Freq: Once | INTRAMUSCULAR | Status: AC | PRN
  Administered 2015-06-02: 100 mL via INTRAVENOUS

## 2015-06-02 MED ORDER — MORPHINE SULFATE (PF) 4 MG/ML IV SOLN
6.0000 mg | Freq: Once | INTRAVENOUS | Status: AC
  Administered 2015-06-02: 6 mg via INTRAVENOUS
  Filled 2015-06-02: qty 2

## 2015-06-02 MED ORDER — FENTANYL CITRATE (PF) 100 MCG/2ML IJ SOLN
50.0000 ug | Freq: Once | INTRAMUSCULAR | Status: AC
  Administered 2015-06-02: 50 ug via INTRAVENOUS
  Filled 2015-06-02: qty 2

## 2015-06-02 MED ORDER — CLINDAMYCIN HCL 150 MG PO CAPS: 300.0000 mg | ORAL_CAPSULE | Freq: Three times a day (TID) | ORAL | Status: DC

## 2015-06-02 MED ORDER — CLINDAMYCIN PHOSPHATE 600 MG/50ML IV SOLN
600.0000 mg | Freq: Once | INTRAVENOUS | Status: AC
  Administered 2015-06-02: 600 mg via INTRAVENOUS
  Filled 2015-06-02: qty 50

## 2015-06-02 NOTE — ED Notes (Signed)
Bed: WA22 Expected date:  Expected time:  Means of arrival:  Comments: 

## 2015-06-02 NOTE — ED Notes (Signed)
Pt arrived to the ED with a complaint of back pain.  Pt seen at [facility] for a kidney abscess but left without getting her urologists number.  Pt states new pain started three days ago and she is sure it is another kidney infection.

## 2015-06-02 NOTE — ED Notes (Signed)
Pt unable to void at this time. 

## 2015-06-02 NOTE — ED Provider Notes (Signed)
CSN: [id number]     Arrival date & time 06/02/15  0054 History   First MD Initiated Contact with Patient 06/02/15 [phone]     Chief Complaint  Patient presents with  . Back Pain     (Consider location/radiation/quality/duration/timing/severity/associated sxs/prior Treatment) HPI Comments: 34 y/o F comes in with cc of back pain. She was admitted to the hospital for few days for a L sided renal abscess, and reports she left w/o any antibiotics. Pain started 3 days ago. No uti like sx. + nausea and emesis. No fevers, chills. No pelvic disorders, no vaginal discharge, and pt is not pregnant.    ROS 10 Systems reviewed and are negative for acute change except as noted in the HPI.     Patient is a 34 y.o. female presenting with back pain. The history is provided by the patient.  Back Pain   Past Medical History  Diagnosis Date  . Seizures   . Chronic back pain    Past Surgical History  Procedure Laterality Date  . Cesarean section    . Arm surgery    . Tubal ligation     History reviewed. No pertinent family history. Social History  Substance Use Topics  . Smoking status: Current Some Day Smoker -- 0.50 packs/day    Types: Cigarettes  . Smokeless tobacco: None  . Alcohol Use: Yes     Comment: occassional drinker   OB History    Gravida Para Term Preterm AB TAB SAB Ectopic Multiple Living   2 2 2             Review of Systems  Constitutional: Positive for activity change.  Genitourinary: Positive for flank pain.  Musculoskeletal: Positive for back pain.      Allergies  Tylenol  Home Medications   Prior to Admission medications   Medication Sig Start Date End Date Taking? Authorizing Provider  clindamycin (CLEOCIN) 150 MG capsule Take 2 capsules (300 mg total) by mouth 3 (three) times daily. 06/02/15   Virgel Manifold, MD  clindamycin (CLEOCIN) 300 MG capsule Take 1 capsule (300 mg total) by mouth 3 (three) times daily. Patient not taking: Reported on 06/02/2015  05/22/15   Velvet Bathe, MD  meloxicam (MOBIC) 7.5 MG tablet Take 1 tablet (7.5 mg total) by mouth daily. Patient not taking: Reported on 06/02/2015 05/22/15   Velvet Bathe, MD   BP 116/66 mmHg  Pulse 80  Temp(Src) 98.4 F (36.9 C) (Oral)  Resp 14  SpO2 98%  LMP 04/19/2015 (Approximate) Physical Exam  Constitutional: She is oriented to person, place, and time. She appears well-developed.  HENT:  Head: Normocephalic and atraumatic.  Eyes: Conjunctivae and EOM are normal. Pupils are equal, round, and reactive to light.  Neck: Normal range of motion. Neck supple.  Cardiovascular: Normal rate, regular rhythm and normal heart sounds.   Pulmonary/Chest: Effort normal and breath sounds normal. No respiratory distress.  Abdominal: Soft. Bowel sounds are normal. She exhibits no distension. There is no tenderness. There is no rebound and no guarding.  Flank tenderness only, L worse than R  Neurological: She is alert and oriented to person, place, and time.  Skin: Skin is warm and dry.  Nursing note and vitals reviewed.   ED Course  Procedures (including critical care time) Labs Review Labs Reviewed  CBC WITH DIFFERENTIAL/PLATELET - Abnormal; Notable for the following:    Eosinophils Relative 6 (*)    All other components within normal limits  BASIC METABOLIC PANEL - Abnormal; Notable  for the following:    BUN <5 (*)    All other components within normal limits  URINALYSIS, ROUTINE W REFLEX MICROSCOPIC (NOT AT [facility]) - Abnormal; Notable for the following:    Leukocytes, UA TRACE (*)    All other components within normal limits  URINE CULTURE  URINE MICROSCOPIC-ADD ON  POC URINE PREG, ED    Imaging Review Ct Abdomen Pelvis W Contrast  06/02/2015   CLINICAL DATA:  Bilateral flank and lower back pain for 3 days. History of recent right renal abscess and drain placed 05/16/2015.  EXAM: CT ABDOMEN AND PELVIS WITH CONTRAST  TECHNIQUE: Multidetector CT imaging of the abdomen and pelvis was  performed using the standard protocol following bolus administration of intravenous contrast.  CONTRAST:  147mL OMNIPAQUE IOHEXOL 300 MG/ML  SOLN  COMPARISON:  05/22/2015  FINDINGS: Interval removal of the drainage catheter from the right perinephric space since prior study. There continues to be a small fluid collection adjacent to the right kidney measuring 1.5 cm in greatest diameter in the area of previous drain position. Separate smaller fluid collection noted near the upper pole of the right kidney measuring 9 mm compared to 12 mm previously. Mild perinephric stranding on the right.  Prior cholecystectomy. Liver, spleen, pancreas, adrenals and left kidney are unremarkable. Uterus, adnexae and urinary bladder unremarkable. Small amount of fluid adjacent to the uterus.  Stomach, large and small bowel unremarkable. Aorta is normal caliber.  IMPRESSION: Interval removal of right perinephric drainage catheter. Small residual fluid collection in the position of previous drainage catheter, measuring 1.5 cm, likely small residual renal abscess. Separate smaller fluid collection adjacent to the upper pole of the right kidney has slightly decreased in size.   Electronically Signed   By: Rolm Baptise M.D.   On: 06/02/2015 09:16   I, Kathrynn Humble, Pao Haffey, personally reviewed and evaluated these images and lab results as part of my medical decision-making.   EKG Interpretation None      MDM   Final diagnoses:  Low back pain without sciatica, unspecified back pain laterality  Perinephric abscess   Pt comes in with back pain. Recent admission for perinephric abscess, and reports that now she has bilateral pain that is similar. She didn't take her clinda that was prescribed (she reports that she never got a script). She has 0 SIRs at arrival. CT ordered- if neg for abscess, she can be discharged.     Varney Biles, MD 06/02/15 [phone]

## 2015-06-02 NOTE — ED Provider Notes (Signed)
Assumed care in sign out with CT pending. Imaging as below. She is pointing to L mid/lower back as area of maximal pain. No CVA tenderness on either side. Afebrile. No leukocytosis. Generally appears well. Has not been on abx since she left hospital. I'm not convinced her pain is necessarily related to collections that persist. Culture data reviewed. Will re-start clindamycin. Stressed importance of being compliant. Outpt FU.    Marland KitchenCt Abdomen Pelvis W Contrast  06/02/2015   CLINICAL DATA:  Bilateral flank and lower back pain for 3 days. History of recent right renal abscess and drain placed 05/16/2015.  EXAM: CT ABDOMEN AND PELVIS WITH CONTRAST  TECHNIQUE: Multidetector CT imaging of the abdomen and pelvis was performed using the standard protocol following bolus administration of intravenous contrast.  CONTRAST:  169mL OMNIPAQUE IOHEXOL 300 MG/ML  SOLN  COMPARISON:  05/22/2015  FINDINGS: Interval removal of the drainage catheter from the right perinephric space since prior study. There continues to be a small fluid collection adjacent to the right kidney measuring 1.5 cm in greatest diameter in the area of previous drain position. Separate smaller fluid collection noted near the upper pole of the right kidney measuring 9 mm compared to 12 mm previously. Mild perinephric stranding on the right.  Prior cholecystectomy. Liver, spleen, pancreas, adrenals and left kidney are unremarkable. Uterus, adnexae and urinary bladder unremarkable. Small amount of fluid adjacent to the uterus.  Stomach, large and small bowel unremarkable. Aorta is normal caliber.  IMPRESSION: Interval removal of right perinephric drainage catheter. Small residual fluid collection in the position of previous drainage catheter, measuring 1.5 cm, likely small residual renal abscess. Separate smaller fluid collection adjacent to the upper pole of the right kidney has slightly decreased in size.   Electronically Signed   By: Rolm Baptise M.D.   On:  06/02/2015 09:16   Ct Abdomen Pelvis W Contrast  05/22/2015   CLINICAL DATA:  Status post percutaneous drainage of right perirenal retroperitoneal abscess on 05/16/2015.  EXAM: CT ABDOMEN AND PELVIS WITH CONTRAST  TECHNIQUE: Multidetector CT imaging of the abdomen and pelvis was performed using the standard protocol following bolus administration of intravenous contrast.  CONTRAST:  45mL OMNIPAQUE IOHEXOL 300 MG/ML  SOLN  COMPARISON:  05/15/2015  FINDINGS: In the location of the right percutaneous drainage catheter just posterior to the right mid kidney, dominant abscess has resolved. Adjacent pockets are also greatly improved with a small lateral pocket adjacent to the upper pole measuring 12 mm and a medial pocket adjacent to the mid kidney measuring 12 mm. Both of these areas were considerably larger on the original scan.  No evidence of hydronephrosis. Other solid organs remain unremarkable. No bowel obstruction or ileus identified. No new abscess identified.  IMPRESSION: Near complete resolution of right perirenal abscesses. Dominant abscess at the level of the percutaneous drainage catheter has completely resolved. Small adjacent pockets of fluid show significant improvement and likely are not in communication currently with the indwelling drain.   Electronically Signed   By: Aletta Edouard M.D.   On: 05/22/2015 16:23   Ct Abdomen Pelvis W Contrast  05/15/2015   CLINICAL DATA:  Acute right lower quadrant abdominal pain.  EXAM: CT ABDOMEN AND PELVIS WITH CONTRAST  TECHNIQUE: Multidetector CT imaging of the abdomen and pelvis was performed using the standard protocol following bolus administration of intravenous contrast.  CONTRAST:  135mL OMNIPAQUE IOHEXOL 300 MG/ML  SOLN  COMPARISON:  CT scan of June 14, 2005.  FINDINGS: Mild right posterior  basilar opacity is noted concerning for pneumonia or atelectasis with associated small loculated effusion. 16 mm lucency is noted in the medial portion the right  iliac wing which is increased compared to prior exam.  Status post cholecystectomy. The liver, spleen and pancreas appear normal. Adrenal glands and left kidney appear normal. Multiple loculated fluid collections are seen in the right pararenal space consistent with multiple abscesses. Minimal right hydroureteronephrosis is noted without obstructing calculus. Urinary bladder appears normal. There is no evidence of bowel obstruction uterus appears normal. Ovaries appear normal. No significant adenopathy is noted.  IMPRESSION: Minimal right hydroureteronephrosis is noted without obstructing calculus. Multiple small loculated fluid collections are noted around the upper and middle pole of the right kidney most consistent with abscesses secondary to pyelonephritis. Right posterior basilar opacity is noted as well consistent with atelectasis or possibly pneumonia with small loculated pleural effusion.  16 mm lucency seen superiorly in right iliac wing which is more prominent compared to prior exam. This may simply represent benign abnormality, but further evaluation with bone scan or MRI is recommended on nonemergent basis.   Electronically Signed   By: Marijo Conception, M.D.   On: 05/15/2015 12:41   Ct Image Guided Drainage By Percutaneous Catheter  05/16/2015   INDICATION: History of right-sided perinephric abscess. Please perform CT-guided percutaneous drainage catheter placement for therapeutic purposes.  EXAM: CT IMAGE GUIDED DRAINAGE BY PERCUTANEOUS CATHETER  COMPARISON:  CT abdomen and pelvis - 05/15/2015  MEDICATIONS: The patient is currently admitted to the hospital and receiving intravenous antibiotics. The antibiotics were administered within an appropriate time frame prior to the initiation of the procedure.  ANESTHESIA/SEDATION: Fentanyl 150 mcg IV; Versed 5 mg IV  Total Moderate Sedation time  15 minutes  CONTRAST:  None  COMPLICATIONS: None immediate  PROCEDURE: Informed written consent was obtained from  the patient after a discussion of the risks, benefits and alternatives to treatment. The patient was placed prone on the CT gantry and a pre procedural CT was performed re-demonstrating the known abscess/fluid collection within the right perinephric space with dominant ill-defined component measuring approximately 4.7 x 1.5 cm (image 30, series 2). The procedure was planned. A timeout was performed prior to the initiation of the procedure.  The skin overlying the midline of the right back was prepped and draped in the usual sterile fashion. The overlying soft tissues were anesthetized with 1% lidocaine with epinephrine. Appropriate trajectory was planned with the use of a 22 gauge spinal needle. An 18 gauge trocar needle was advanced into the abscess/fluid collection and a short Amplatz super stiff wire was coiled within the collection. Appropriate positioning was confirmed with a limited CT scan. The tract was serially dilated allowing placement of a 10 [place] all-purpose drainage catheter. Appropriate positioning was confirmed with a limited postprocedural CT scan.  Approximately 10 Ml of purulent fluid was aspirated. The tube was connected to a JP bulb and sutured in place. A dressing was placed. The patient tolerated the procedure well without immediate post procedural complication.  IMPRESSION: Successful CT guided placement of a 10 French all purpose drain catheter into the right perinephric space with aspiration of 10 mL of purulent fluid. Samples were sent to the laboratory as requested by the ordering clinical team.   Electronically Signed   By: Sandi Mariscal M.D.   On: 05/16/2015 14:20    Virgel Manifold, MD 06/05/15 [phone]

## 2015-06-02 NOTE — Discharge Instructions (Signed)
Back Pain, Adult Low back pain is very common. About 1 in 5 people have back pain.The cause of low back pain is rarely dangerous. The pain often gets better over time.About half of people with a sudden onset of back pain feel better in just 2 weeks. About 8 in 10 people feel better by 6 weeks.  CAUSES Some common causes of back pain include:  Strain of the muscles or ligaments supporting the spine.  Wear and tear (degeneration) of the spinal discs.  Arthritis.  Direct injury to the back. DIAGNOSIS Most of the time, the direct cause of low back pain is not known.However, back pain can be treated effectively even when the exact cause of the pain is unknown.Answering your caregiver's questions about your overall health and symptoms is one of the most accurate ways to make sure the cause of your pain is not dangerous. If your caregiver needs more information, he or she may order lab work or imaging tests (X-rays or MRIs).However, even if imaging tests show changes in your back, this usually does not require surgery. HOME CARE INSTRUCTIONS For many people, back pain returns.Since low back pain is rarely dangerous, it is often a condition that people can learn to manageon their own.   Remain active. It is stressful on the back to sit or stand in one place. Do not sit, drive, or stand in one place for more than 30 minutes at a time. Take short walks on level surfaces as soon as pain allows.Try to increase the length of time you walk each day.  Do not stay in bed.Resting more than 1 or 2 days can delay your recovery.  Do not avoid exercise or work.Your body is made to move.It is not dangerous to be active, even though your back may hurt.Your back will likely heal faster if you return to being active before your pain is gone.  Pay attention to your body when you bend and lift. Many people have less discomfortwhen lifting if they bend their knees, keep the load close to their bodies,and  avoid twisting. Often, the most comfortable positions are those that put less stress on your recovering back.  Find a comfortable position to sleep. Use a firm mattress and lie on your side with your knees slightly bent. If you lie on your back, put a pillow under your knees.  Only take over-the-counter or prescription medicines as directed by your caregiver. Over-the-counter medicines to reduce pain and inflammation are often the most helpful.Your caregiver may prescribe muscle relaxant drugs.These medicines help dull your pain so you can more quickly return to your normal activities and healthy exercise.  Put ice on the injured area.  Put ice in a plastic bag.  Place a towel between your skin and the bag.  Leave the ice on for 15-20 minutes, 03-04 times a day for the first 2 to 3 days. After that, ice and heat may be alternated to reduce pain and spasms.  Ask your caregiver about trying back exercises and gentle massage. This may be of some benefit.  Avoid feeling anxious or stressed.Stress increases muscle tension and can worsen back pain.It is important to recognize when you are anxious or stressed and learn ways to manage it.Exercise is a great option. SEEK MEDICAL CARE IF:  You have pain that is not relieved with rest or medicine.  You have pain that does not improve in 1 week.  You have new symptoms.  You are generally not feeling well. SEEK   IMMEDIATE MEDICAL CARE IF:   You have pain that radiates from your back into your legs.  You develop new bowel or bladder control problems.  You have unusual weakness or numbness in your arms or legs.  You develop nausea or vomiting.  You develop abdominal pain.  You feel faint. Document Released: 10/03/2005 Document Revised: 04/03/2012 Document Reviewed: 02/04/2014 Exit[employer] Patient Information 2015 [employer]. This information is not intended to replace advice given to you by your health care provider. Make sure you  discuss any questions you have with your health care provider.  

## 2015-06-03 LAB — URINE CULTURE: Culture: NO GROWTH

## 2015-06-22 ENCOUNTER — Telehealth (HOSPITAL_BASED_OUTPATIENT_CLINIC_OR_DEPARTMENT_OTHER): Payer: Self-pay | Admitting: Emergency Medicine

## 2015-06-24 ENCOUNTER — Emergency Department (HOSPITAL_COMMUNITY): Payer: Self-pay

## 2015-06-24 ENCOUNTER — Emergency Department (HOSPITAL_COMMUNITY): Payer: Medicaid Other

## 2015-06-24 ENCOUNTER — Emergency Department (HOSPITAL_COMMUNITY)
Admission: EM | Admit: 2015-06-24 | Discharge: 2015-06-24 | Discharge status: Against Medical Advice | Payer: Self-pay | Attending: Emergency Medicine | Admitting: Emergency Medicine

## 2015-06-24 ENCOUNTER — Encounter (HOSPITAL_COMMUNITY): Payer: Self-pay | Admitting: *Deleted

## 2015-06-24 DIAGNOSIS — F1910 Other psychoactive substance abuse, uncomplicated: Secondary | ICD-10-CM | POA: Insufficient documentation

## 2015-06-24 DIAGNOSIS — Z765 Malingerer [conscious simulation]: Secondary | ICD-10-CM

## 2015-06-24 DIAGNOSIS — R109 Unspecified abdominal pain: Secondary | ICD-10-CM

## 2015-06-24 DIAGNOSIS — Z7289 Other problems related to lifestyle: Secondary | ICD-10-CM | POA: Insufficient documentation

## 2015-06-24 DIAGNOSIS — N289 Disorder of kidney and ureter, unspecified: Secondary | ICD-10-CM | POA: Insufficient documentation

## 2015-06-24 DIAGNOSIS — F1990 Other psychoactive substance use, unspecified, uncomplicated: Secondary | ICD-10-CM

## 2015-06-24 DIAGNOSIS — Z720 Tobacco use: Secondary | ICD-10-CM | POA: Insufficient documentation

## 2015-06-24 DIAGNOSIS — Z3202 Encounter for pregnancy test, result negative: Secondary | ICD-10-CM | POA: Insufficient documentation

## 2015-06-24 DIAGNOSIS — G8929 Other chronic pain: Secondary | ICD-10-CM | POA: Insufficient documentation

## 2015-06-24 DIAGNOSIS — Z9851 Tubal ligation status: Secondary | ICD-10-CM | POA: Insufficient documentation

## 2015-06-24 LAB — URINE MICROSCOPIC-ADD ON

## 2015-06-24 LAB — CBC WITH DIFFERENTIAL/PLATELET
Basophils Absolute: 0 10*3/uL (ref 0.0–0.1)
Basophils Relative: 0 % (ref 0–1)
Eosinophils Absolute: 0.1 10*3/uL (ref 0.0–0.7)
Eosinophils Relative: 1 % (ref 0–5)
HCT: 40.2 % (ref 36.0–46.0)
Hemoglobin: 13.4 g/dL (ref 12.0–15.0)
Lymphocytes Relative: 7 % — ABNORMAL LOW (ref 12–46)
Lymphs Abs: 1.2 10*3/uL (ref 0.7–4.0)
MCH: 29.8 pg (ref 26.0–34.0)
MCHC: 33.3 g/dL (ref 30.0–36.0)
MCV: 89.3 fL (ref 78.0–100.0)
Monocytes Absolute: 0.7 10*3/uL (ref 0.1–1.0)
Monocytes Relative: 4 % (ref 3–12)
Neutro Abs: 14.4 10*3/uL — ABNORMAL HIGH (ref 1.7–7.7)
Neutrophils Relative %: 88 % — ABNORMAL HIGH (ref 43–77)
Platelets: 218 10*3/uL (ref 150–400)
RBC: 4.5 MIL/uL (ref 3.87–5.11)
RDW: 15 % (ref 11.5–15.5)
WBC: 16.4 10*3/uL — ABNORMAL HIGH (ref 4.0–10.5)

## 2015-06-24 LAB — I-STAT CG4 LACTIC ACID, ED
Lactic Acid, Venous: 1.76 mmol/L (ref 0.5–2.0)
Lactic Acid, Venous: 0.9 mmol/L (ref 0.5–2.0)

## 2015-06-24 LAB — COMPREHENSIVE METABOLIC PANEL
ALT: 148 U/L — ABNORMAL HIGH (ref 14–54)
AST: 100 U/L — ABNORMAL HIGH (ref 15–41)
Albumin: 2.9 g/dL — ABNORMAL LOW (ref 3.5–5.0)
Alkaline Phosphatase: 89 U/L (ref 38–126)
Anion gap: 7 (ref 5–15)
BUN: 10 mg/dL (ref 6–20)
CO2: 24 mmol/L (ref 22–32)
Calcium: 8.4 mg/dL — ABNORMAL LOW (ref 8.9–10.3)
Chloride: 105 mmol/L (ref 101–111)
Creatinine, Ser: 0.59 mg/dL (ref 0.44–1.00)
GFR calc Af Amer: >60 mL/min (ref >60)
GFR calc non Af Amer: >60 mL/min (ref >60)
Glucose, Bld: 121 mg/dL — ABNORMAL HIGH (ref 65–99)
Potassium: 4.1 mmol/L (ref 3.5–5.1)
Sodium: 136 mmol/L (ref 135–145)
Total Bilirubin: 0.6 mg/dL (ref 0.3–1.2)
Total Protein: 6.6 g/dL (ref 6.5–8.1)

## 2015-06-24 LAB — URINALYSIS, ROUTINE W REFLEX MICROSCOPIC
Bilirubin Urine: NEGATIVE
Glucose, UA: NEGATIVE mg/dL
Ketones, ur: NEGATIVE mg/dL
Nitrite: NEGATIVE
Protein, ur: NEGATIVE mg/dL
Specific Gravity, Urine: 1.004 — ABNORMAL LOW (ref 1.005–1.030)
Urobilinogen, UA: 0.2 mg/dL (ref 0.0–1.0)
pH: 6.5 (ref 5.0–8.0)

## 2015-06-24 LAB — I-STAT BETA HCG BLOOD, ED (MC, WL, AP ONLY): I-stat hCG, quantitative: <5 m[IU]/mL (ref <5)

## 2015-06-24 MED ORDER — IOHEXOL 300 MG/ML  SOLN
100.0000 mL | Freq: Once | INTRAMUSCULAR | Status: AC | PRN
  Administered 2015-06-24: 100 mL via INTRAVENOUS

## 2015-06-24 MED ORDER — DEXTROSE 5 % IV SOLN
1.0000 g | Freq: Once | INTRAVENOUS | Status: AC
  Administered 2015-06-24: 1 g via INTRAVENOUS
  Filled 2015-06-24: qty 10
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
## 2015-06-24 NOTE — ED Notes (Signed)
Bed: WA08 Expected date:  Expected time:  Means of arrival:  Comments: 

## 2015-06-24 NOTE — ED Notes (Signed)
Mylo Red, Rn in to attempt an ultrasound IV.  Dr. Dorothyann Gibbs aware that CT has not been done yet.

## 2015-06-24 NOTE — ED Notes (Signed)
md made aware of pts vitals

## 2015-06-24 NOTE — ED Notes (Signed)
Patient wanting to leave.  She states, "she is tired of waiting".  [place] iv inserted by M. Roxan Hockey, RN.  I told patient that was her right; however, she should reconsider especially since we just got new IV access.  CT notified.  Dr. Lonie Peak given update.
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
## 2015-07-14 ENCOUNTER — Inpatient Hospital Stay (HOSPITAL_COMMUNITY): Payer: No Typology Code available for payment source | Admitting: Anesthesiology

## 2015-07-14 ENCOUNTER — Encounter (HOSPITAL_COMMUNITY): Payer: Self-pay | Admitting: Emergency Medicine

## 2015-07-14 ENCOUNTER — Inpatient Hospital Stay (HOSPITAL_COMMUNITY)
Admission: EM | Admit: 2015-07-14 | Discharge: 2015-07-16 | DRG: 580 | Disposition: A | Discharge status: Home / Self Care | Payer: No Typology Code available for payment source | Attending: Internal Medicine | Admitting: Internal Medicine

## 2015-07-14 ENCOUNTER — Emergency Department (HOSPITAL_COMMUNITY): Payer: No Typology Code available for payment source

## 2015-07-14 ENCOUNTER — Encounter (HOSPITAL_COMMUNITY)
Admission: EM | Disposition: A | Discharge status: Home / Self Care | Payer: Self-pay | Source: Home / Self Care | Attending: Internal Medicine

## 2015-07-14 DIAGNOSIS — T368X5A Adverse effect of other systemic antibiotics, initial encounter: Secondary | ICD-10-CM | POA: Diagnosis present

## 2015-07-14 DIAGNOSIS — Z8619 Personal history of other infectious and parasitic diseases: Secondary | ICD-10-CM

## 2015-07-14 DIAGNOSIS — F1110 Opioid abuse, uncomplicated: Secondary | ICD-10-CM | POA: Diagnosis present

## 2015-07-14 DIAGNOSIS — L03119 Cellulitis of unspecified part of limb: Secondary | ICD-10-CM | POA: Diagnosis present

## 2015-07-14 DIAGNOSIS — F17210 Nicotine dependence, cigarettes, uncomplicated: Secondary | ICD-10-CM | POA: Diagnosis present

## 2015-07-14 DIAGNOSIS — L500 Allergic urticaria: Secondary | ICD-10-CM | POA: Diagnosis present

## 2015-07-14 DIAGNOSIS — L02512 Cutaneous abscess of left hand: Principal | ICD-10-CM | POA: Diagnosis present

## 2015-07-14 DIAGNOSIS — M549 Dorsalgia, unspecified: Secondary | ICD-10-CM | POA: Diagnosis present

## 2015-07-14 DIAGNOSIS — Y92230 Patient room in hospital as the place of occurrence of the external cause: Secondary | ICD-10-CM

## 2015-07-14 DIAGNOSIS — L03012 Cellulitis of left finger: Secondary | ICD-10-CM | POA: Diagnosis present

## 2015-07-14 DIAGNOSIS — G8929 Other chronic pain: Secondary | ICD-10-CM | POA: Diagnosis present

## 2015-07-14 DIAGNOSIS — L03114 Cellulitis of left upper limb: Secondary | ICD-10-CM | POA: Diagnosis present

## 2015-07-14 HISTORY — DX: Anxiety disorder, unspecified: F41.9

## 2015-07-14 HISTORY — DX: Gastric ulcer, unspecified as acute or chronic, without hemorrhage or perforation: K25.9

## 2015-07-14 HISTORY — DX: Major depressive disorder, single episode, unspecified: F32.9

## 2015-07-14 HISTORY — PX: INCISION AND DRAINAGE: SHX5863

## 2015-07-14 HISTORY — DX: Other generalized epilepsy and epileptic syndromes, not intractable, without status epilepticus: G40.409

## 2015-07-14 HISTORY — DX: Gastro-esophageal reflux disease without esophagitis: K21.9

## 2015-07-14 HISTORY — PX: I&D EXTREMITY: SHX5045

## 2015-07-14 HISTORY — DX: Low back pain: M54.5

## 2015-07-14 HISTORY — DX: Headache: R51

## 2015-07-14 HISTORY — DX: Migraine, unspecified, not intractable, without status migrainosus: G43.909

## 2015-07-14 HISTORY — DX: Headache, unspecified: R51.9

## 2015-07-14 HISTORY — DX: Low back pain, unspecified: M54.50

## 2015-07-14 HISTORY — DX: Other chronic pain: G89.29

## 2015-07-14 HISTORY — DX: Depression, unspecified: F32.A

## 2015-07-14 LAB — CBC WITH DIFFERENTIAL/PLATELET
Basophils Absolute: 0 10*3/uL (ref 0.0–0.1)
Basophils Relative: 0 %
Eosinophils Absolute: 0.2 10*3/uL (ref 0.0–0.7)
Eosinophils Relative: 2 %
HCT: 34.7 % — ABNORMAL LOW (ref 36.0–46.0)
Hemoglobin: 11.1 g/dL — ABNORMAL LOW (ref 12.0–15.0)
Lymphocytes Relative: 48 %
Lymphs Abs: 4.9 10*3/uL — ABNORMAL HIGH (ref 0.7–4.0)
MCH: 28.7 pg (ref 26.0–34.0)
MCHC: 32 g/dL (ref 30.0–36.0)
MCV: 89.7 fL (ref 78.0–100.0)
Monocytes Absolute: 0.5 10*3/uL (ref 0.1–1.0)
Monocytes Relative: 5 %
Neutro Abs: 4.6 10*3/uL (ref 1.7–7.7)
Neutrophils Relative %: 45 %
Platelets: 534 10*3/uL — ABNORMAL HIGH (ref 150–400)
RBC: 3.87 MIL/uL (ref 3.87–5.11)
RDW: 15.9 % — ABNORMAL HIGH (ref 11.5–15.5)
WBC: 10.1 10*3/uL (ref 4.0–10.5)

## 2015-07-14 LAB — COMPREHENSIVE METABOLIC PANEL
ALT: 32 U/L (ref 14–54)
AST: 20 U/L (ref 15–41)
Albumin: 3 g/dL — ABNORMAL LOW (ref 3.5–5.0)
Alkaline Phosphatase: 112 U/L (ref 38–126)
Anion gap: 8 (ref 5–15)
BUN: 6 mg/dL (ref 6–20)
CO2: 24 mmol/L (ref 22–32)
Calcium: 9.2 mg/dL (ref 8.9–10.3)
Chloride: 109 mmol/L (ref 101–111)
Creatinine, Ser: 0.64 mg/dL (ref 0.44–1.00)
GFR calc Af Amer: >60 mL/min (ref >60)
GFR calc non Af Amer: >60 mL/min (ref >60)
Glucose, Bld: 82 mg/dL (ref 65–99)
Potassium: 4 mmol/L (ref 3.5–5.1)
Sodium: 141 mmol/L (ref 135–145)
Total Bilirubin: 1 mg/dL (ref 0.3–1.2)
Total Protein: 7.1 g/dL (ref 6.5–8.1)

## 2015-07-14 LAB — I-STAT CG4 LACTIC ACID, ED: Lactic Acid, Venous: 1.12 mmol/L (ref 0.5–2.0)

## 2015-07-14 SURGERY — IRRIGATION AND DEBRIDEMENT EXTREMITY
Anesthesia: General | Laterality: Left

## 2015-07-14 MED ORDER — MORPHINE SULFATE (PF) 4 MG/ML IV SOLN
4.0000 mg | Freq: Once | INTRAVENOUS | Status: DC
  Filled 2015-07-14: qty 1

## 2015-07-14 MED ORDER — CEFAZOLIN SODIUM-DEXTROSE 2-3 GM-% IV SOLR
INTRAVENOUS | Status: DC | PRN
  Administered 2015-07-14: 2 g via INTRAVENOUS

## 2015-07-14 MED ORDER — HYDROMORPHONE HCL 1 MG/ML IJ SOLN
INTRAMUSCULAR | Status: AC
  Administered 2015-07-14: 0.5 mg via INTRAVENOUS
  Filled 2015-07-14: qty 1

## 2015-07-14 MED ORDER — KETOROLAC TROMETHAMINE 30 MG/ML IJ SOLN
INTRAMUSCULAR | Status: AC
  Administered 2015-07-14: 30 mg via INTRAVENOUS
  Filled 2015-07-14: qty 1

## 2015-07-14 MED ORDER — ALBUTEROL SULFATE HFA 108 (90 BASE) MCG/ACT IN AERS
INHALATION_SPRAY | RESPIRATORY_TRACT | Status: AC
  Filled 2015-07-14: qty 6.7

## 2015-07-14 MED ORDER — MIDAZOLAM HCL 2 MG/2ML IJ SOLN
INTRAMUSCULAR | Status: AC
  Filled 2015-07-14: qty 2

## 2015-07-14 MED ORDER — LIDOCAINE HCL (CARDIAC) 20 MG/ML IV SOLN
INTRAVENOUS | Status: DC | PRN
  Administered 2015-07-14: 100 mg via INTRAVENOUS

## 2015-07-14 MED ORDER — VANCOMYCIN HCL IN DEXTROSE 1-5 GM/200ML-% IV SOLN
1000.0000 mg | Freq: Two times a day (BID) | INTRAVENOUS | Status: DC
  Administered 2015-07-15: 1000 mg via INTRAVENOUS
  Filled 2015-07-14 (×2): qty 200

## 2015-07-14 MED ORDER — PIPERACILLIN-TAZOBACTAM 3.375 G IVPB 30 MIN
3.3750 g | Freq: Once | INTRAVENOUS | Status: AC
  Administered 2015-07-14: 3.375 g via INTRAVENOUS
  Filled 2015-07-14: qty 50

## 2015-07-14 MED ORDER — SODIUM CHLORIDE 0.9 % IV BOLUS (SEPSIS)
1000.0000 mL | Freq: Once | INTRAVENOUS | Status: AC
  Administered 2015-07-14: 1000 mL via INTRAVENOUS

## 2015-07-14 MED ORDER — HYDROMORPHONE HCL 1 MG/ML IJ SOLN
0.5000 mg | Freq: Once | INTRAMUSCULAR | Status: AC
  Administered 2015-07-14: 0.5 mg via INTRAVENOUS
  Filled 2015-07-14: qty 1

## 2015-07-14 MED ORDER — PROMETHAZINE HCL 25 MG/ML IJ SOLN: 6.2500 mg | INTRAMUSCULAR | Status: DC | PRN

## 2015-07-14 MED ORDER — FENTANYL CITRATE (PF) 100 MCG/2ML IJ SOLN
INTRAMUSCULAR | Status: DC | PRN
  Administered 2015-07-14 (×5): 50 ug via INTRAVENOUS

## 2015-07-14 MED ORDER — MORPHINE SULFATE (PF) 4 MG/ML IV SOLN
4.0000 mg | Freq: Once | INTRAVENOUS | Status: AC
  Administered 2015-07-14: 4 mg via INTRAVENOUS
  Filled 2015-07-14: qty 1

## 2015-07-14 MED ORDER — ONDANSETRON HCL 4 MG PO TABS: 4.0000 mg | ORAL_TABLET | Freq: Four times a day (QID) | ORAL | Status: DC | PRN

## 2015-07-14 MED ORDER — SODIUM CHLORIDE 0.9 % IR SOLN
Status: DC | PRN
  Administered 2015-07-14 (×2): 1000 mL

## 2015-07-14 MED ORDER — FENTANYL CITRATE (PF) 250 MCG/5ML IJ SOLN
INTRAMUSCULAR | Status: AC
  Filled 2015-07-14: qty 5

## 2015-07-14 MED ORDER — PNEUMOCOCCAL VAC POLYVALENT 25 MCG/0.5ML IJ INJ
0.5000 mL | INJECTION | INTRAMUSCULAR | Status: AC
  Administered 2015-07-16: 0.5 mL via INTRAMUSCULAR
  Filled 2015-07-14: qty 0.5

## 2015-07-14 MED ORDER — KETOROLAC TROMETHAMINE 30 MG/ML IJ SOLN
30.0000 mg | Freq: Three times a day (TID) | INTRAMUSCULAR | Status: DC | PRN
  Administered 2015-07-14 – 2015-07-16 (×5): 30 mg via INTRAVENOUS
  Filled 2015-07-14 (×5): qty 1

## 2015-07-14 MED ORDER — BUPIVACAINE HCL (PF) 0.25 % IJ SOLN
INTRAMUSCULAR | Status: AC
  Filled 2015-07-14: qty 30

## 2015-07-14 MED ORDER — SODIUM CHLORIDE 0.9 % IV SOLN
INTRAVENOUS | Status: DC | PRN
  Administered 2015-07-14: 20:00:00 via INTRAVENOUS

## 2015-07-14 MED ORDER — HYDROMORPHONE HCL 1 MG/ML IJ SOLN
INTRAMUSCULAR | Status: AC
  Filled 2015-07-14: qty 1

## 2015-07-14 MED ORDER — PROPOFOL 10 MG/ML IV BOLUS
INTRAVENOUS | Status: DC | PRN
  Administered 2015-07-14: 150 mg via INTRAVENOUS
  Administered 2015-07-14: 50 mg via INTRAVENOUS

## 2015-07-14 MED ORDER — HYDROMORPHONE HCL 1 MG/ML IJ SOLN
0.2500 mg | INTRAMUSCULAR | Status: DC | PRN
  Administered 2015-07-14 (×4): 0.5 mg via INTRAVENOUS

## 2015-07-14 MED ORDER — ONDANSETRON HCL 4 MG/2ML IJ SOLN: 4.0000 mg | Freq: Four times a day (QID) | INTRAMUSCULAR | Status: DC | PRN

## 2015-07-14 MED ORDER — HYDROMORPHONE HCL 1 MG/ML IJ SOLN
INTRAMUSCULAR | Status: DC | PRN
  Administered 2015-07-14 (×2): .5 mg via INTRAVENOUS

## 2015-07-14 MED ORDER — LACTATED RINGERS IV SOLN
INTRAVENOUS | Status: DC
Start: 2015-07-14 — End: 2015-07-15
  Administered 2015-07-15: via INTRAVENOUS

## 2015-07-14 MED ORDER — CEFAZOLIN SODIUM-DEXTROSE 2-3 GM-% IV SOLR
INTRAVENOUS | Status: AC
  Filled 2015-07-14: qty 50

## 2015-07-14 MED ORDER — VANCOMYCIN HCL IN DEXTROSE 1-5 GM/200ML-% IV SOLN
1000.0000 mg | Freq: Once | INTRAVENOUS | Status: AC
  Administered 2015-07-14: 1000 mg via INTRAVENOUS
  Filled 2015-07-14: qty 200

## 2015-07-14 MED ORDER — INFLUENZA VAC SPLIT QUAD 0.5 ML IM SUSY
0.5000 mL | PREFILLED_SYRINGE | INTRAMUSCULAR | Status: AC
  Administered 2015-07-16: 0.5 mL via INTRAMUSCULAR
  Filled 2015-07-14: qty 0.5

## 2015-07-14 MED ORDER — PIPERACILLIN-TAZOBACTAM 3.375 G IVPB
3.3750 g | Freq: Three times a day (TID) | INTRAVENOUS | Status: DC
  Administered 2015-07-15 – 2015-07-16 (×5): 3.375 g via INTRAVENOUS
  Filled 2015-07-14 (×8): qty 50

## 2015-07-14 MED ORDER — ONDANSETRON HCL 4 MG/2ML IJ SOLN
INTRAMUSCULAR | Status: DC | PRN
  Administered 2015-07-14: 4 mg via INTRAVENOUS

## 2015-07-14 MED ORDER — MIDAZOLAM HCL 5 MG/5ML IJ SOLN
INTRAMUSCULAR | Status: DC | PRN
  Administered 2015-07-14: 2 mg via INTRAVENOUS

## 2015-07-14 SURGICAL SUPPLY — 43 items
BAG DECANTER FOR FLEXI CONT (MISCELLANEOUS) ×3 IMPLANT
BANDAGE ELASTIC 3 VELCRO ST LF (GAUZE/BANDAGES/DRESSINGS) ×2 IMPLANT
BANDAGE ELASTIC 4 VELCRO ST LF (GAUZE/BANDAGES/DRESSINGS) IMPLANT
BNDG GAUZE ELAST 4 BULKY (GAUZE/BANDAGES/DRESSINGS) ×3 IMPLANT
CORDS BIPOLAR (ELECTRODE) IMPLANT
CUFF TOURNIQUET SINGLE 18IN (TOURNIQUET CUFF) IMPLANT
DRAPE SURG 17X23 STRL (DRAPES) ×3 IMPLANT
ELECT REM PT RETURN 9FT ADLT (ELECTROSURGICAL)
ELECTRODE REM PT RTRN 9FT ADLT (ELECTROSURGICAL) IMPLANT
GAUZE PACKING IODOFORM 1/4X15 (GAUZE/BANDAGES/DRESSINGS) ×3 IMPLANT
GAUZE PACKING IODOFORM 1/4X5 (PACKING) IMPLANT
GAUZE SPONGE 4X4 12PLY STRL (GAUZE/BANDAGES/DRESSINGS) ×3 IMPLANT
GAUZE XEROFORM 1X8 LF (GAUZE/BANDAGES/DRESSINGS) ×3 IMPLANT
GLOVE BIOGEL M STRL SZ7.5 (GLOVE) ×3 IMPLANT
GLOVE BIOGEL PI IND STRL 6.5 (GLOVE) IMPLANT
GLOVE BIOGEL PI INDICATOR 6.5 (GLOVE) ×2
GLOVE SURG SS PI 6.5 STRL IVOR (GLOVE) ×2 IMPLANT
GOWN STRL REUS W/ TWL LRG LVL3 (GOWN DISPOSABLE) ×2 IMPLANT
GOWN STRL REUS W/TWL LRG LVL3 (GOWN DISPOSABLE) ×6
HANDPIECE INTERPULSE COAX TIP (DISPOSABLE)
KIT BASIN OR (CUSTOM PROCEDURE TRAY) ×3 IMPLANT
KIT ROOM TURNOVER OR (KITS) ×3 IMPLANT
MANIFOLD NEPTUNE II (INSTRUMENTS) ×3 IMPLANT
NDL HYPO 25GX1X1/2 BEV (NEEDLE) IMPLANT
NEEDLE HYPO 25GX1X1/2 BEV (NEEDLE) IMPLANT
NS IRRIG 1000ML POUR BTL (IV SOLUTION) ×3 IMPLANT
PACK ORTHO EXTREMITY (CUSTOM PROCEDURE TRAY) ×3 IMPLANT
PAD ARMBOARD 7.5X6 YLW CONV (MISCELLANEOUS) ×6 IMPLANT
PAD CAST 4YDX4 CTTN HI CHSV (CAST SUPPLIES) IMPLANT
PADDING CAST COTTON 4X4 STRL (CAST SUPPLIES)
SET HNDPC FAN SPRY TIP SCT (DISPOSABLE) IMPLANT
SOAP 2 % CHG 4 OZ (WOUND CARE) ×3 IMPLANT
SPONGE GAUZE 4X4 12PLY STER LF (GAUZE/BANDAGES/DRESSINGS) ×3 IMPLANT
SPONGE LAP 18X18 X RAY DECT (DISPOSABLE) IMPLANT
SPONGE LAP 4X18 X RAY DECT (DISPOSABLE) ×3 IMPLANT
SYR CONTROL 10ML LL (SYRINGE) IMPLANT
TOWEL OR 17X24 6PK STRL BLUE (TOWEL DISPOSABLE) ×3 IMPLANT
TOWEL OR 17X26 10 PK STRL BLUE (TOWEL DISPOSABLE) ×3 IMPLANT
TUBE ANAEROBIC SPECIMEN COL (MISCELLANEOUS) IMPLANT
TUBE CONNECTING 12'X1/4 (SUCTIONS) ×1
TUBE CONNECTING 12X1/4 (SUCTIONS) ×2 IMPLANT
WATER STERILE IRR 1000ML POUR (IV SOLUTION) ×3 IMPLANT
YANKAUER SUCT BULB TIP NO VENT (SUCTIONS) ×3 IMPLANT
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
## 2015-07-14 NOTE — ED Notes (Signed)
Attempted report 

## 2015-07-14 NOTE — ED Provider Notes (Signed)
CSN: [id number]     Arrival date & time 07/14/15  1407 History   First MD Initiated Contact with Patient 07/14/15 1706     Chief Complaint  Patient presents with  . Abscess  . 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 She is heroin in her hand a couple days ago. She is reporting that she has had increasing erythema and swelling for the last 3 days. She's not had any medical care in the intervening days. Patient reports mild fever home nausea vomiting.   Past Medical History  Diagnosis Date  . Seizures   . Chronic back pain    Past Surgical History  Procedure Laterality Date  . Cesarean section    . Arm surgery    . Tubal ligation     History reviewed. No pertinent family history. Social History  Substance Use Topics  . Smoking status: Current Some Day Smoker -- 0.50 packs/day    Types: Cigarettes  . Smokeless tobacco: None  . Alcohol Use: Yes     Comment: occassional drinker   OB History    Gravida Para Term Preterm AB TAB SAB Ectopic Multiple Living   2 2 2             Review of Systems  Constitutional: Positive for fever. Negative for activity change and fatigue.  HENT: Negative for congestion and drooling.   Eyes: Negative for discharge.  Respiratory: Negative for cough and chest tightness.   Cardiovascular: Negative for chest pain.  Gastrointestinal: Negative for abdominal distention.  Genitourinary: Negative for dysuria and difficulty urinating.  Musculoskeletal: Negative for joint swelling.  Skin: Negative for rash.  Allergic/Immunologic: Negative for immunocompromised state.  Neurological: Negative for seizures and speech difficulty.  Psychiatric/Behavioral: Negative for behavioral problems and agitation.      Allergies  Tylenol  Home Medications   Prior to Admission medications   Medication Sig Start Date End Date Taking? Authorizing  Provider  clindamycin (CLEOCIN) 150 MG capsule Take 2 capsules (300 mg total) by mouth 3 (three) times daily. Patient not taking: Reported on 06/24/2015 06/02/15   Virgel Manifold, MD  clindamycin (CLEOCIN) 300 MG capsule Take 1 capsule (300 mg total) by mouth 3 (three) times daily. Patient not taking: Reported on 06/02/2015 05/22/15   Velvet Bathe, MD  meloxicam (MOBIC) 7.5 MG tablet Take 1 tablet (7.5 mg total) by mouth daily. Patient not taking: Reported on 06/02/2015 05/22/15   Velvet Bathe, MD   BP 112/75 mmHg  Pulse 94  Temp(Src) 98.5 F (36.9 C) (Oral)  Resp 16  SpO2 97%  LMP 06/20/2015 Physical Exam  Constitutional: She is oriented to person, place, and time. She appears well-developed and well-nourished.  HENT:  Head: Normocephalic and atraumatic.  Eyes: Conjunctivae are normal. Right eye exhibits no discharge.  Neck: Neck supple.  Cardiovascular: Normal rate, regular rhythm and normal heart sounds.   No murmur heard. Pulmonary/Chest: Effort normal and breath sounds normal. She has no wheezes. She has no rales.  Abdominal: Soft. She exhibits no distension. There is no tenderness.  Musculoskeletal: Normal range of motion. She exhibits no edema.  Patient is left-hand with extensive edema and erythema tracking all the way up to mid forearm. Her left third finger has fusiform swelling with pain on passive and active motion. Tenderness along the tendon flexor.  Neurological: She is oriented to person, place, and time. No cranial nerve deficit.  Skin: Skin is warm and  dry. No rash noted. She is not diaphoretic.  Psychiatric: She has a normal mood and affect. Her behavior is normal.  Nursing note and vitals reviewed.   ED Course  Procedures (including critical care time) Labs Review Labs Reviewed  CBC WITH DIFFERENTIAL/PLATELET - Abnormal; Notable for the following:    Hemoglobin 11.1 (*)    HCT 34.7 (*)    RDW 15.9 (*)    Platelets 534 (*)    Lymphs Abs 4.9 (*)    All other  components within normal limits  CULTURE, BLOOD (ROUTINE X 2)  CULTURE, BLOOD (ROUTINE X 2)  COMPREHENSIVE METABOLIC PANEL  I-STAT CG4 LACTIC ACID, ED    Imaging Review No results found. I have personally reviewed and evaluated these images and lab results as part of my medical decision-making.   EKG Interpretation None      MDM   Final diagnoses:  None    Patient's a 34 year old female who injected Dilaudid or heroin into her dorsum of her left hand 4 days ago. She is at increased erythema and swelling. Concern for flexor tenosyndivitis. Patient meets all of kanavel signs: fusiform sweeling, in flexion, tenderness and pain with passive extension.   We will order broad-spectrum antibiotics and call hand consult. Patient will require inpatient admission for IV antibiotics   Hand on board. Will admit to medicine for IV abx.  Leandra Vanderweele Julio Alm, MD 07/15/15 2542

## 2015-07-14 NOTE — Transfer of Care (Signed)
Immediate Anesthesia Transfer of Care Note  Patient: [name]  Procedure(s) Performed: Procedure(s): IRRIGATION AND DEBRIDEMENT LEFT HAND (Left)  Patient Location: PACU  Anesthesia Type:General  Level of Consciousness: awake, oriented, sedated, patient cooperative and responds to stimulation  Airway & Oxygen Therapy: Patient Spontanous Breathing and Patient connected to nasal cannula oxygen  Post-op Assessment: Report given to RN, Post -op Vital signs reviewed and stable, Patient moving all extremities and Patient moving all extremities X 4  Post vital signs: Reviewed and stable  Last Vitals:  Filed Vitals:   07/14/15 2000  BP: 103/72  Pulse: 81  Temp:   Resp:     Complications: No apparent anesthesia complications

## 2015-07-14 NOTE — ED Notes (Signed)
Pt taken to OR holding room 36.

## 2015-07-14 NOTE — ED Notes (Signed)
Redness to arm and hand outlined with skin marker
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
## 2015-07-14 NOTE — ED Notes (Signed)
Pt here with redness and swelling to left hand after injecting a crushed pill 1 week ago; pt sts redness and swelling x 4 days

## 2015-07-14 NOTE — Anesthesia Preprocedure Evaluation (Signed)
Anesthesia Evaluation  Patient identified by MRN, date of birth, ID band Patient awake    Reviewed: Allergy & Precautions, NPO status , Patient's Chart, lab work & pertinent test results  History of Anesthesia Complications Negative for: history of anesthetic complications  Airway Mallampati: II  TM Distance: >3 FB Neck ROM: Full    Dental  (+) Edentulous Upper, Edentulous Lower, Upper Dentures, Lower Dentures   Pulmonary Current Smoker (0.5 PPD),    Pulmonary exam normal breath sounds clear to auscultation       Cardiovascular Exercise Tolerance: Good (-) hypertension(-) anginanegative cardio ROS Normal cardiovascular exam Rhythm:Regular Rate:Normal     Neuro/Psych Seizures -,  PSYCHIATRIC DISORDERS Anxiety    GI/Hepatic negative GI ROS, (+)     substance abuse (last used heroin 1 week ago)  IV drug use,   Endo/Other  negative endocrine ROS  Renal/GU negative Renal ROS     Musculoskeletal negative musculoskeletal ROS (+)   Abdominal   Peds  Hematology negative hematology ROS (+)   Anesthesia Other Findings Day of surgery medications reviewed with the patient.  Reproductive/Obstetrics                             Anesthesia Physical Anesthesia Plan  ASA: II  Anesthesia Plan: General   Post-op Pain Management:    Induction: Intravenous  Airway Management Planned: LMA  Additional Equipment:   Intra-op Plan:   Post-operative Plan: Extubation in OR  Informed Consent: I have reviewed the patients History and Physical, chart, labs and discussed the procedure including the risks, benefits and alternatives for the proposed anesthesia with the patient or authorized representative who has indicated his/her understanding and acceptance.   Dental advisory given  Plan Discussed with: CRNA  Anesthesia Plan Comments: (Risks/benefits of general anesthesia discussed with patient  including risk of damage to teeth, lips, gum, and tongue, nausea/vomiting, allergic reactions to medications, and the possibility of heart attack, stroke and death.  All patient questions answered.  Patient wishes to proceed.)        Anesthesia Quick Evaluation

## 2015-07-14 NOTE — H&P (Signed)
PCP:   No PCP Per Patient   Chief Complaint:  Left hand swollen  HPI: 34 yo female comes in after injecting heroin/and dilaudid into her left hand 2 days ago.  Since then, the hand as progressively gotten more swollen and red and painful.  Denies fevers.  She reports that she hardly ever injects heroin.  shes only done this maybe ten times in her life.  However she does report that she is buying roxicodone 20mg  tablets twice a day for several years.  Hand surgeon dr Lenon Curt has been called and plan to go to OR tonight.  Pt referred for admission for her infection.  Review of Systems:  Positive and negative as per HPI otherwise all other systems are negative  Past Medical History: Past Medical History  Diagnosis Date  . Seizures   . Chronic back pain    Past Surgical History  Procedure Laterality Date  . Cesarean section    . Arm surgery    . Tubal ligation      Medications: Prior to Admission medications   Medication Sig Start Date End Date Taking? Authorizing Provider  Aspirin-Salicylamide-Caffeine (BC HEADACHE POWDER PO) Take 1 Package by mouth daily as needed (pain).   Yes Historical Provider, MD  ibuprofen (ADVIL,MOTRIN) 200 MG tablet Take 200 mg by mouth every 6 (six) hours as needed for mild pain or moderate pain.   Yes Historical Provider, MD  clindamycin (CLEOCIN) 150 MG capsule Take 2 capsules (300 mg total) by mouth 3 (three) times daily. Patient not taking: Reported on 06/24/2015 06/02/15   Virgel Manifold, MD  clindamycin (CLEOCIN) 300 MG capsule Take 1 capsule (300 mg total) by mouth 3 (three) times daily. Patient not taking: Reported on 06/02/2015 05/22/15   Velvet Bathe, MD  meloxicam (MOBIC) 7.5 MG tablet Take 1 tablet (7.5 mg total) by mouth daily. Patient not taking: Reported on 06/02/2015 05/22/15   Velvet Bathe, MD    Allergies:   Allergies  Allergen Reactions  . Hydrocodone-Acetaminophen Itching    Blotches on neck and chest, burning sensation to stomach     Social History:  reports that she has been smoking Cigarettes.  She has been smoking about 0.50 packs per day. She does not have any smokeless tobacco history on file. She reports that she drinks alcohol. She reports that she uses illicit drugs (IV and Marijuana).  Family History: No premature CAD  Physical Exam: Filed Vitals:   07/14/15 1646 07/14/15 1730 07/14/15 1930 07/14/15 2000  BP: 112/75 108/60 108/61 103/72  Pulse: 94 79 84 81  Temp: 98.5 F (36.9 C)     TempSrc: Oral     Resp: 16     SpO2: 97% 99% 98% 98%   General appearance: alert, cooperative and no distress Head: Normocephalic, without obvious abnormality, atraumatic Eyes: negative Nose: Nares normal. Septum midline. Mucosa normal. No drainage or sinus tenderness. Neck: no JVD and supple, symmetrical, trachea midline Lungs: clear to auscultation bilaterally Heart: regular rate and rhythm, S1, S2 normal, no murmur, click, rub or gallop Abdomen: soft, non-tender; bowel sounds normal; no masses,  no organomegaly Extremities: extremities normal, atraumatic, no cyanosis or edema x below Pulses: 2+ and symmetric Skin: Skin color, texture, turgor normal. No rashes or lesions x left hand swollen, red cellulitis past elbow last 3 digits swollen and painful to movement Neurologic: Grossly normal    Labs on Admission:   Recent Labs  07/14/15 1655  NA 141  K 4.0  CL 109  CO2 24  GLUCOSE 82  BUN 6  CREATININE 0.64  CALCIUM 9.2    Recent Labs  07/14/15 1655  AST 20  ALT 32  ALKPHOS 112  BILITOT 1.0  PROT 7.1  ALBUMIN 3.0*    Recent Labs  07/14/15 1655  WBC 10.1  NEUTROABS 4.6  HGB 11.1*  HCT 34.7*  MCV 89.7  PLT 534*     Radiological Exams on Admission: Dg Hand 2 View Left  07/14/2015   CLINICAL DATA:  Erythema and swelling over the last 3 days.  EXAM: LEFT HAND - 2 VIEW  COMPARISON:  None.  FINDINGS: There is no evidence of fracture or dislocation. There is no evidence of arthropathy or  other focal bone abnormality. Severe soft tissue swelling over the dorsal aspect of the hand.  IMPRESSION: Severe soft tissue swelling over the dorsal aspect of the hand.  No acute osseous injury of the left hand.   Electronically Signed   By: Kathreen Devoid   On: 07/14/2015 18:14    Assessment/Plan  34 yo female with h/o heroin, polysubstance abuse comes in with left hand cellutitis  Principal Problem:   Cellulitis of hand left- keep npo and hold anticoagulants as pt going to OR tonight.  Iv vancomycin and zosyn ordered.  Provide prn toradol for pain.  Her pain should improve postoperatively after wash out.  If toradol does not work, would try to minimize controlled substances if possible.    Active Problems:   Heroin abuse- advised this is not a healthy choice, pt understands   Drug abuse, morphine type-  Also advised injecting dilaudid that is not under the supervision of a physician is not a healthy choice  Admit to med surg.  Full code.  Mother was in the room, patient gave me permission prior to our interview for her mother to be present.    Elster Corbello A 07/14/2015, 8:32 PM

## 2015-07-14 NOTE — Anesthesia Procedure Notes (Signed)
Procedure Name: LMA Insertion Date/Time: 07/14/2015 9:26 PM Performed by: Jacquiline Doe A Pre-anesthesia Checklist: Patient identified, Emergency Drugs available, Suction available, Patient being monitored and Timeout performed Patient Re-evaluated:Patient Re-evaluated prior to inductionOxygen Delivery Method: Circle system utilized Preoxygenation: Pre-oxygenation with 100% oxygen Intubation Type: IV induction Ventilation: Mask ventilation without difficulty LMA: LMA inserted LMA Size: 4.0 Tube type: Oral Number of attempts: 1 Placement Confirmation: positive ETCO2 and breath sounds checked- equal and bilateral Tube secured with: Tape Dental Injury: Teeth and Oropharynx as per pre-operative assessment

## 2015-07-14 NOTE — Anesthesia Postprocedure Evaluation (Signed)
  Anesthesia Post-op Note  Patient: [name]  Procedure(s) Performed: Procedure(s) (LRB): IRRIGATION AND DEBRIDEMENT LEFT HAND (Left)  Patient Location: PACU  Anesthesia Type: General  Level of Consciousness: awake and alert   Airway and Oxygen Therapy: Patient Spontanous Breathing  Post-op Pain: mild  Post-op Assessment: Post-op Vital signs reviewed, Patient's Cardiovascular Status Stable, Respiratory Function Stable, Patent Airway and No signs of Nausea or vomiting  Last Vitals:  Filed Vitals:   07/14/15 2230  BP: 120/77  Pulse: 79  Temp:   Resp: 12    Post-op Vital Signs: stable   Complications: No apparent anesthesia complications

## 2015-07-14 NOTE — ED Notes (Signed)
Pt attempted to leave the ER. Pt was dressed, stating that she just wanted to get the food that was being delivered, and say good bye to her daughter. Pt was upset when this RN prevented her from leaving. Pt eventually returned to her room, escorted by this RN. Pt getting changed back into her gown.

## 2015-07-14 NOTE — Progress Notes (Signed)
ANTIBIOTIC CONSULT NOTE - INITIAL  Pharmacy Consult for vancomycin + zosyn Indication: cellulitis  Allergies  Allergen Reactions  . Tylenol [Acetaminophen] Other (See Comments)    Burns stomach    Patient Measurements:   Adjusted Body Weight:   Vital Signs: Temp: 98.5 F (36.9 C) (09/27 1646) Temp Source: Oral (09/27 1646) BP: 112/75 mmHg (09/27 1646) Pulse Rate: 94 (09/27 1646) Intake/Output from previous day:   Intake/Output from this shift:    Labs:  Recent Labs  07/14/15 1655  WBC 10.1  HGB 11.1*  PLT 534*  CREATININE 0.64   CrCl cannot be calculated (Unknown ideal weight.). No results for input(s): VANCOTROUGH, VANCOPEAK, VANCORANDOM, GENTTROUGH, GENTPEAK, GENTRANDOM, TOBRATROUGH, TOBRAPEAK, TOBRARND, AMIKACINPEAK, AMIKACINTROU, AMIKACIN in the last 72 hours.   Microbiology: No results found for this or any previous visit (from the past 720 hour(s)).  Medical History: Past Medical History  Diagnosis Date  . Seizures   . Chronic back pain     Medications:  Anti-infectives    Start     Dose/Rate Route Frequency Ordered Stop   07/15/15 0630  vancomycin (VANCOCIN) IVPB 1000 mg/200 mL premix     1,000 mg 200 mL/hr over 60 Minutes Intravenous Every 12 hours 07/14/15 1752     07/14/15 2330  piperacillin-tazobactam (ZOSYN) IVPB 3.375 g     3.375 g 12.5 mL/hr over 240 Minutes Intravenous Every 8 hours 07/14/15 1752     07/14/15 1730  vancomycin (VANCOCIN) IVPB 1000 mg/200 mL premix     1,000 mg 200 mL/hr over 60 Minutes Intravenous  Once 07/14/15 1718     07/14/15 1730  piperacillin-tazobactam (ZOSYN) IVPB 3.375 g     3.375 g 100 mL/hr over 30 Minutes Intravenous  Once 07/14/15 1718       Assessment: 27 yof presented to the ED with redness and swelling after drug injection 1 week PTA. Pt is afebrile and WBC is WNL. Scr and lactic acid are WNL.   Vanc 9/27>> Zosyn 9/27>>  Goal of Therapy:  Vancomycin trough level 10-15 mcg/ml  Plan:  - Vancomycin  1gm IV Q12H - Zosyn 3.375gm IV Q8H (4 hr inf) - F/u renal fxn, C&S, clinical status and trough at [place], Rande Lawman 07/14/2015,5:52 PM

## 2015-07-14 NOTE — ED Notes (Signed)
David, MD at bedside.  

## 2015-07-15 ENCOUNTER — Encounter (HOSPITAL_COMMUNITY): Payer: Self-pay | Admitting: General Surgery

## 2015-07-15 DIAGNOSIS — L03119 Cellulitis of unspecified part of limb: Secondary | ICD-10-CM

## 2015-07-15 DIAGNOSIS — F1110 Opioid abuse, uncomplicated: Secondary | ICD-10-CM

## 2015-07-15 MED ORDER — OXYCODONE-ACETAMINOPHEN 5-325 MG PO TABS
2.0000 | ORAL_TABLET | Freq: Once | ORAL | Status: AC
  Administered 2015-07-15: 2 via ORAL
  Filled 2015-07-15: qty 2

## 2015-07-15 MED ORDER — IBUPROFEN 600 MG PO TABS
600.0000 mg | ORAL_TABLET | Freq: Four times a day (QID) | ORAL | Status: DC | PRN
  Administered 2015-07-15 – 2015-07-16 (×3): 600 mg via ORAL
  Filled 2015-07-15 (×3): qty 1

## 2015-07-15 MED ORDER — HYDROMORPHONE HCL 1 MG/ML IJ SOLN
1.0000 mg | Freq: Once | INTRAMUSCULAR | Status: AC
  Administered 2015-07-15: 1 mg via INTRAVENOUS
  Filled 2015-07-15: qty 1

## 2015-07-15 MED ORDER — DOXYCYCLINE HYCLATE 100 MG PO TABS
100.0000 mg | ORAL_TABLET | Freq: Two times a day (BID) | ORAL | Status: DC
  Administered 2015-07-15 – 2015-07-16 (×3): 100 mg via ORAL
  Filled 2015-07-15 (×3): qty 1

## 2015-07-15 MED ORDER — OXYCODONE-ACETAMINOPHEN 5-325 MG PO TABS
1.0000 | ORAL_TABLET | Freq: Four times a day (QID) | ORAL | Status: DC | PRN
  Administered 2015-07-15 – 2015-07-16 (×5): 2 via ORAL
  Filled 2015-07-15 (×5): qty 2

## 2015-07-15 NOTE — Progress Notes (Signed)
[facility] PROGRESS NOTE   [name] WER:[id number] DOB: December 17, 1980 DOA: 07/14/2015 PCP: No PCP Per Patient  HPI/Subjective: Seen with her mother at bedside, denies fever or chills. Complaining about pain around the I&D site. Had? Reaction to vancomycin. 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  Assessment/Plan: Principal Problem:   Cellulitis of hand Active Problems:   Heroin abuse   Drug abuse, morphine type   Cellulitis of the left hand/ring finger Presented with cellulitis/abscess involving the ring finger. Patient seen by hand surgery and I&D was done. Recommended to continue IV antibiotics and follow cultures.  IV drug abuse Heroin abuse, patient also uses oxycodone orally. Patient currently on Percocet, avoid IV narcotics.  Possible antibiotics allergy Patient has had some hives around injection site or the vancomycin. After the vancomycin discontinued the hives disappeared. It's likely red man syndrome, but will discontinue IV vancomycin, start oral doxycycline.  Code Status: Full Code Family Communication: Plan discussed with the patient. Disposition Plan: Remains inpatient Diet: Diet regular Room service appropriate?: Yes; Fluid consistency:: Thin  Consultants:  Had surgery  Procedures:  I&D of the left ring finger  Antibiotics:  Vancomycin and Zosyn   Objective: Filed Vitals:   07/15/15 0500  BP: 107/66  Pulse: 78  Temp: 97.8 F (36.6 C)  Resp: 18    Intake/Output Summary (Last 24 hours) at 07/15/15 1220 Last data filed at 07/15/15 0655  Gross per 24 hour  Intake 1656.25 ml  Output     10 ml  Net 1646.25 ml   Filed Weights   07/14/15 2336  Weight: 70.398 kg (155 lb 3.2 oz)    Exam: General: Alert and awake, oriented x3, not in any acute distress. HEENT: anicteric sclera, pupils reactive to light and accommodation, EOMI CVS: S1-S2 clear, no murmur rubs or gallops Chest: clear to auscultation bilaterally, no wheezing, rales or  rhonchi Abdomen: soft nontender, nondistended, normal bowel sounds, no organomegaly Extremities: no cyanosis, clubbing or edema noted bilaterally Neuro: Cranial nerves II-XII intact, no focal neurological deficits  Data Reviewed: Basic Metabolic Panel:  Recent Labs Lab 07/14/15 1655  NA 141  K 4.0  CL 109  CO2 24  GLUCOSE 82  BUN 6  CREATININE 0.64  CALCIUM 9.2   Liver Function Tests:  Recent Labs Lab 07/14/15 1655  AST 20  ALT 32  ALKPHOS 112  BILITOT 1.0  PROT 7.1  ALBUMIN 3.0*   No results for input(s): LIPASE, AMYLASE in the last 168 hours. No results for input(s): AMMONIA in the last 168 hours. CBC:  Recent Labs Lab 07/14/15 1655  WBC 10.1  NEUTROABS 4.6  HGB 11.1*  HCT 34.7*  MCV 89.7  PLT 534*   Cardiac Enzymes: No results for input(s): CKTOTAL, CKMB, CKMBINDEX, TROPONINI in the last 168 hours. BNP (last 3 results) No results for input(s): BNP in the last 8760 hours.  ProBNP (last 3 results) No results for input(s): PROBNP in the last 8760 hours.  CBG: No results for input(s): GLUCAP in the last 168 hours.  Micro Recent Results (from the past 240 hour(s))  Culture, routine-abscess     Status: None (Preliminary result)   Collection Time: 07/14/15  9:38 PM  Result Value Ref Range Status   Specimen Description ABSCESS LEFT HAND  Final   Special Requests NONE  Final   Gram Stain   Final    RARE WBC PRESENT,BOTH PMN AND MONONUCLEAR NO SQUAMOUS EPITHELIAL CELLS SEEN NO ORGANISMS SEEN Performed at [employer]  PENDING  Incomplete   Report Status PENDING  Incomplete     Studies: Dg Hand 2 View Left  07/14/2015   CLINICAL DATA:  Erythema and swelling over the last 3 days.  EXAM: LEFT HAND - 2 VIEW  COMPARISON:  None.  FINDINGS: There is no evidence of fracture or dislocation. There is no evidence of arthropathy or other focal bone abnormality. Severe soft tissue swelling over the dorsal aspect of the hand.  IMPRESSION:  Severe soft tissue swelling over the dorsal aspect of the hand.  No acute osseous injury of the left hand.   Electronically Signed   By: Kathreen Devoid   On: 07/14/2015 18:14    Scheduled Meds: . doxycycline  100 mg Oral Q12H  . Influenza vac split quadrivalent PF  0.5 mL Intramuscular Tomorrow-1000  . piperacillin-tazobactam (ZOSYN)  IV  3.375 g Intravenous Q8H  . pneumococcal 23 valent vaccine  0.5 mL Intramuscular Tomorrow-1000  . vancomycin  1,000 mg Intravenous Q12H   Continuous Infusions: . lactated ringers 75 mL/hr at 07/15/15 0002       Time spent: 35 minutes    [facility] A  [facility] Pager [phone] If 7PM-7AM, please contact night-coverage at [email or web address], password [facility] 07/15/2015, 12:20 PM  LOS: 1 day

## 2015-07-15 NOTE — Progress Notes (Signed)
Patient developed hives near IV site at right arm after 30 minutes of Vancomycin IVPB infusion, Vancomycin IVPB then stopped.  Pharmacy and on-call floor coverage MD made aware of the reaction.  On-call MD called back to inform this nurse that she will discuss it with Pharmacist.  On-call MD called back again and informed this nurse to stop it for now and will endorse this to incoming MD.

## 2015-07-15 NOTE — Op Note (Signed)
NAMERAHI, CHANDONNET              ACCOUNT NO.:  [id number]  MEDICAL RECORD NO.:  [id number]  LOCATION:  6N21C                        FACILITY:  [facility]  PHYSICIAN:  Dennie Bible, MD    DATE OF BIRTH:  1980-12-09  DATE OF PROCEDURE:  07/14/2015 DATE OF DISCHARGE:                              OPERATIVE REPORT   PREOPERATIVE DIAGNOSIS:  Infection of the left hand and left ring finger.  POSTOPERATIVE DIAGNOSIS:  Infection of the left hand and left ring finger.  PROCEDURE:  Incision and drainage of dorsal left hand and dorsal left ring finger, drainage of extensor tendon sheath, packing with iodoform gauze.  INDICATIONS:  Remell Giaimo is a 34 year old female, presented to the emergency room after injecting substance, potentially heroin in the dorsum of her left hand several days ago.  She has had increased redness, swelling, and immobility of the hand, and specifically the left ring finger.  On evaluation, she had extreme swelling and erythema of the dorsum of the hand with some track marks overlying the proximal portion of the left ring finger, extending into the ring finger.  The ring finger was severely swollen and erythematous.  Risks, benefits, and alternatives of surgery were discussed with her.  She agreed with surgery.  Consent was obtained.  DESCRIPTION OF PROCEDURE:  The patient was taken to the operating room, placed supine on the operating table.  General anesthesia was administered without difficulty.  Time-out was performed.  She had received preoperative Zosyn in the emergency room, 2 g of Ancef was given.  The arm was elevated, tourniquet was inflated to 250 mmHg.  The point of most fluctuance on the dorsal hand was then incised.  There was some purulence that was obtained.  This tract was followed deeply and some fibrinous infected material was encountered.  This was debrided. Following this, wound was irrigated with approximately a liter of normal saline  solution.  Distally along the dorsal aspect of the proximal phalanx of the ring finger, another incision was made, carried down to the extensor tendon sheath.  The sheath was opened and both of these cavities were irrigated again with a liter of normal saline.  All remaining infectious material that was encountered was debrided with a sponge as well as sharply debrided.  Hemostasis controlled with bipolar. The tourniquet was released and additional irrigation was performed.  A couple of interrupted 5-0 nylon sutures were placed in the wound to gently approximate the wound and the wound was packed with iodoform gauze and a sterile dressing was applied.  The patient tolerated the procedure well.  She will be admitted to the hospital for IV antibiotics to clear the infection.     Dennie Bible, MD     HCC/MEDQ  D:  07/14/2015  T:  07/15/2015  Job:  [id number]

## 2015-07-15 NOTE — Progress Notes (Signed)
   07/15/15 1130  Clinical Encounter Type  Visited With Patient and family together  Visit Type Initial  Referral From Nurse  Stress Factors  Patient Stress Factors (Desire to leave and go home to be with duaghter)  Chaplain visited with Pt; Chaplain heard Pt repeat the words of "it was a mistake" what she did to get in the hospital; Chaplain and Pt shared a few laughs and converse about Pt life outside of [facility]; Pt still looking forward to going home; Chaplain is here for Pt upon request

## 2015-07-15 NOTE — Progress Notes (Signed)
ANTIBIOTIC CONSULT NOTE - FOLLOW UP  Pharmacy Consult:  Zosyn Indication:  Cellulitis with abscess  Allergies  Allergen Reactions  . Hydrocodone-Acetaminophen Itching    Blotches on neck and chest, burning sensation to stomach  . Vancomycin Hives    Patient Measurements: Height: 5\' 2"  (157.5 cm) Weight: 155 lb 3.2 oz (70.398 kg) IBW/kg (Calculated) : 50.1  Vital Signs: Temp: 97.8 F (36.6 C) (09/28 0500) BP: 107/66 mmHg (09/28 0500) Pulse Rate: 78 (09/28 0500) Intake/Output from previous day: 09/27 0701 - 09/28 0700 In: 1656.3 [P.O.:240; I.V.:1316.3; IV Piggyback:100] Out: 10 [Blood:10]  Labs:  Recent Labs  07/14/15 1655  WBC 10.1  HGB 11.1*  PLT 534*  CREATININE 0.64   Estimated Creatinine Clearance: 92.8 mL/min (by C-G formula based on Cr of 0.64). No results for input(s): VANCOTROUGH, VANCOPEAK, VANCORANDOM, GENTTROUGH, GENTPEAK, GENTRANDOM, TOBRATROUGH, TOBRAPEAK, TOBRARND, AMIKACINPEAK, AMIKACINTROU, AMIKACIN in the last 72 hours.   Microbiology: Recent Results (from the past 720 hour(s))  Culture, routine-abscess     Status: None (Preliminary result)   Collection Time: 07/14/15  9:38 PM  Result Value Ref Range Status   Specimen Description ABSCESS LEFT HAND  Final   Special Requests NONE  Final   Gram Stain   Final    RARE WBC PRESENT,BOTH PMN AND MONONUCLEAR NO SQUAMOUS EPITHELIAL CELLS SEEN NO ORGANISMS SEEN Performed at [facility]    Culture PENDING  Incomplete   Report Status PENDING  Incomplete      Assessment: 50 YOM with history IVDE presented with left hand swelling/abscess and started on vancomycin and Zosyn.  She is now s/p I&D of hand abscess.  Patient's renal function has been stable.  Vancomycin has been switched to doxycycline due to concern of hives around IV site when vancomycin was infusing.  Zosyn 9/27 >> Vanc 9/27 >> 9/28 Doxy 9/28 >>  9/27 BCx x2 - 9/27 left hand abscess -   Goal of Therapy:  Resolution of  infection   Plan:  - Continue Zosyn 3.375gm IV Q8H, 4 hr infusion - Doxy 100mg  PO BID per MD - Pharmacy will sign off as dosage adjustment is likely unnecessary.  Thank you for the consult!    [name], PharmD, BCPS Pager:  [phone] 07/15/2015, 12:44 PM

## 2015-07-15 NOTE — Progress Notes (Signed)
Hives near IV site developed from Vancomycin IVPB infusion already disappeared as of this writing.

## 2015-07-15 NOTE — Progress Notes (Signed)
S: some hand pain, hives with Vanc  O:Blood pressure 107/66, pulse 78, temperature 97.8 F (36.6 C), temperature source Oral, resp. rate 18, height 5\' 2"  (1.575 m), weight 70.398 kg (155 lb 3.2 oz), last menstrual period 06/20/2015, SpO2 98 %.  Packing L hand/finger removed no purulent drainage, still signif swelling of RF, some erythema hand  A:s/p I&D L hand and RF   P: f/u cx's, cont IV abx, ? Need additional since Vanc gives hives, wound care

## 2015-07-16 MED ORDER — CEPHALEXIN 500 MG PO CAPS: 500.0000 mg | ORAL_CAPSULE | Freq: Three times a day (TID) | ORAL | Status: DC

## 2015-07-16 MED ORDER — DOXYCYCLINE HYCLATE 100 MG PO TABS: 100.0000 mg | ORAL_TABLET | Freq: Two times a day (BID) | ORAL | Status: DC

## 2015-07-16 MED ORDER — OXYCODONE-ACETAMINOPHEN 5-325 MG PO TABS: 1.0000 | ORAL_TABLET | Freq: Three times a day (TID) | ORAL | Status: DC | PRN

## 2015-07-16 NOTE — Discharge Summary (Signed)
Physician Discharge Summary  [name] ZOX:[id number] DOB: November 26, 1980 DOA: 07/14/2015  PCP: No PCP Per Patient  Admit date: 07/14/2015 Discharge date: 07/16/2015  Time spent: 40 minutes  Recommendations for Outpatient Follow-up:  1. Follow-up with primary care physician within one week. 2. Follow-up with Dr. Lenon Curt in 1 week.  Discharge Diagnoses:  Principal Problem:   Cellulitis of hand Active Problems:   Heroin abuse   Drug abuse, morphine type   Discharge Condition: Stable  Diet recommendation: Regular  Filed Weights   07/14/15 2336  Weight: 70.398 kg (155 lb 3.2 oz)    History of present illness:  34 yo female comes in after injecting heroin/and dilaudid into her left hand 2 days ago. Since then, the hand as progressively gotten more swollen and red and painful. Denies fevers. She reports that she hardly ever injects heroin. shes only done this maybe ten times in her life. However she does report that she is buying roxicodone 20mg  tablets twice a day for several years. Hand surgeon dr Lenon Curt has been called and plan to go to OR tonight. Pt referred for admission for her infection.  Hospital Course:   Cellulitis of the left hand/ring finger Presented with cellulitis/abscess involving the ring finger. Patient seen by hand surgery and I&D was done. Recommended to continue IV antibiotics. Culture did not show any growth for now. Discharge on doxycycline and Keflex for 10 more days.  IV drug abuse Heroin abuse, patient also uses oxycodone orally. Patient currently on Percocet, avoid IV narcotics. Discharge home on Percocet 5/325, only 20 pills given.  Possible antibiotics allergy Patient has had some hives around injection site or the vancomycin. After the vancomycin discontinued the hives disappeared. 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  History of MSSA right perirenal abscess Does not complain about any back or  flank pain.   Procedures:  Incision and drainage of the left ring finger and hand.  Consultations:  Dr. Lenon Curt, hand surgery  Discharge Exam: Filed Vitals:   07/16/15 0530  BP: 93/52  Pulse: 52  Temp:   Resp:    General: Alert and awake, oriented x3, not in any acute distress. HEENT: anicteric sclera, pupils reactive to light and accommodation, EOMI CVS: S1-S2 clear, no murmur rubs or gallops Chest: clear to auscultation bilaterally, no wheezing, rales or rhonchi Abdomen: soft nontender, nondistended, normal bowel sounds, no organomegaly Extremities: no cyanosis, clubbing or edema noted bilaterally Neuro: Cranial nerves II-XII intact, no focal neurological deficits  Discharge Instructions   Discharge Instructions    Diet - low sodium heart healthy    Complete by:  As directed      Increase activity slowly    Complete by:  As directed           Current Discharge Medication List    START taking these medications   Details  cephALEXin (KEFLEX) 500 MG capsule Take 1 capsule (500 mg total) by mouth 3 (three) times daily. Qty: 30 capsule, Refills: 0    doxycycline (VIBRA-TABS) 100 MG tablet Take 1 tablet (100 mg total) by mouth 2 (two) times daily. Qty: 20 tablet, Refills: 0    oxyCODONE-acetaminophen (PERCOCET/ROXICET) 5-325 MG tablet Take 1-2 tablets by mouth every 8 (eight) hours as needed for moderate pain or severe pain. Qty: 20 tablet, Refills: 0      CONTINUE these medications which have NOT CHANGED   Details  ibuprofen (ADVIL,MOTRIN) 200 MG tablet Take 200 mg by mouth every 6 (six)  hours as needed for mild pain or moderate pain.      STOP taking these medications     Aspirin-Salicylamide-Caffeine (BC HEADACHE POWDER PO)      clindamycin (CLEOCIN) 150 MG capsule      clindamycin (CLEOCIN) 300 MG capsule      meloxicam (MOBIC) 7.5 MG tablet        Allergies  Allergen Reactions  . Hydrocodone-Acetaminophen Itching    Blotches on neck and chest,  burning sensation to stomach  . Vancomycin Hives    Questionable red man syndrome, hives around IV site disappeared after vanc discontinued per documentation on 07/15/15.  Not an ABSOLUTE contraindication to vanc.   Follow-up Information    Follow up with Rayvon Char, MD. Schedule an appointment as soon as possible for a visit in 1 week.   Specialty:  General Surgery   Contact information:   [address] [address] [phone]        The results of significant diagnostics from this hospitalization (including imaging, microbiology, ancillary and laboratory) are listed below for reference.    Significant Diagnostic Studies: Ct Abdomen Pelvis W Contrast  06/24/2015   CLINICAL DATA:  Patient admitted 05/15/2015 with perirenal abscesses. Status post drainage. Now with dysuria for 1 week and right flank pain for 3 days. The patient did not complete antibiotic therapy.  EXAM: CT ABDOMEN AND PELVIS WITH CONTRAST  TECHNIQUE: Multidetector CT imaging of the abdomen and pelvis was performed using the standard protocol following bolus administration of intravenous contrast.  CONTRAST:  100 mL OMNIPAQUE IOHEXOL 300 MG/ML  SOLN  COMPARISON:  CT abdomen and pelvis 06/02/2015.  FINDINGS: The lung bases are clear.  No pleural or pericardial effusion.  There is stranding about the right kidney. Perirenal abscesses seen on the comparison examination are no longer identified. A small focus of scar is seen in the posterior right kidney in the midpole. Both kidneys enhance symmetrically and homogeneously. The left kidney is unremarkable. There is no hydronephrosis on the right or left and no renal or ureteral stones are identified.  The gallbladder has been removed. The liver, spleen, adrenal glands and pancreas appear normal. Uterus, adnexa and urinary bladder are unremarkable. The stomach, small and large bowel and appendix appear normal. No lymphadenopathy or fluid is seen.   No focal bony abnormality is identified.  IMPRESSION: Improved appearance of the right kidney. Previously seen perinephric abscesses are no longer identified. There is some stranding about the right kidney. No new abnormality since the prior exam.   Electronically Signed   By: Inge Rise M.D.   On: 06/24/2015 15:05   Dg Hand 2 View Left  07/14/2015   CLINICAL DATA:  Erythema and swelling over the last 3 days.  EXAM: LEFT HAND - 2 VIEW  COMPARISON:  None.  FINDINGS: There is no evidence of fracture or dislocation. There is no evidence of arthropathy or other focal bone abnormality. Severe soft tissue swelling over the dorsal aspect of the hand.  IMPRESSION: Severe soft tissue swelling over the dorsal aspect of the hand.  No acute osseous injury of the left hand.   Electronically Signed   By: Kathreen Devoid   On: 07/14/2015 18:14    Microbiology: Recent Results (from the past 240 hour(s))  Culture, blood (routine x 2)     Status: None (Preliminary result)   Collection Time: 07/14/15  4:55 PM  Result Value Ref Range Status   Specimen Description BLOOD RIGHT  FOREARM  Final   Special Requests BOTTLES DRAWN AEROBIC ONLY 5CC  Final   Culture NO GROWTH < 24 HOURS  Final   Report Status PENDING  Incomplete  Culture, blood (routine x 2)     Status: None (Preliminary result)   Collection Time: 07/14/15  5:10 PM  Result Value Ref Range Status   Specimen Description BLOOD RIGHT ARM  Final   Special Requests BOTTLES DRAWN AEROBIC AND ANAEROBIC 5CC  Final   Culture NO GROWTH < 24 HOURS  Final   Report Status PENDING  Incomplete  Culture, routine-abscess     Status: None (Preliminary result)   Collection Time: 07/14/15  9:38 PM  Result Value Ref Range Status   Specimen Description ABSCESS LEFT HAND  Final   Special Requests NONE  Final   Gram Stain   Final    RARE WBC PRESENT,BOTH PMN AND MONONUCLEAR NO SQUAMOUS EPITHELIAL CELLS SEEN NO ORGANISMS SEEN Performed at [facility]    Culture  PENDING  Incomplete   Report Status PENDING  Incomplete     Labs: Basic Metabolic Panel:  Recent Labs Lab 07/14/15 1655  NA 141  K 4.0  CL 109  CO2 24  GLUCOSE 82  BUN 6  CREATININE 0.64  CALCIUM 9.2   Liver Function Tests:  Recent Labs Lab 07/14/15 1655  AST 20  ALT 32  ALKPHOS 112  BILITOT 1.0  PROT 7.1  ALBUMIN 3.0*   No results for input(s): LIPASE, AMYLASE in the last 168 hours. No results for input(s): AMMONIA in the last 168 hours. CBC:  Recent Labs Lab 07/14/15 1655  WBC 10.1  NEUTROABS 4.6  HGB 11.1*  HCT 34.7*  MCV 89.7  PLT 534*   Cardiac Enzymes: No results for input(s): CKTOTAL, CKMB, CKMBINDEX, TROPONINI in the last 168 hours. BNP: BNP (last 3 results) No results for input(s): BNP in the last 8760 hours.  ProBNP (last 3 results) No results for input(s): PROBNP in the last 8760 hours.  CBG: No results for input(s): GLUCAP in the last 168 hours.     Signed:  Tyshan Enderle A  [facility] 07/16/2015, 10:35 AM

## 2015-07-16 NOTE — Progress Notes (Signed)
Discharge paperwork given to patient. IV removed. Prescriptions given to patient. No questions verbalized.

## 2015-07-16 NOTE — Progress Notes (Signed)
S:hand feels better  O:Blood pressure 93/52, pulse 52, temperature 97.8 F (36.6 C), temperature source Oral, resp. rate 16, height 5\' 2"  (1.575 m), weight 70.398 kg (155 lb 3.2 oz), last menstrual period 06/20/2015, SpO2 100 %.  Hand and finger with much less erythema, swelling,   A:s/p I&D hand and finger   P:home with dressing changes, abx f/u 1 wk

## 2015-07-16 NOTE — Discharge Instructions (Signed)
Irrigate and pack wounds 3x day and keep covered

## 2015-07-18 LAB — CULTURE, ROUTINE-ABSCESS

## 2015-07-19 LAB — CULTURE, BLOOD (ROUTINE X 2)
Culture: NO GROWTH
Culture: NO GROWTH

## 2016-04-06 ENCOUNTER — Emergency Department (HOSPITAL_COMMUNITY)
Admission: EM | Admit: 2016-04-06 | Discharge: 2016-04-06 | Disposition: A | Discharge status: Home / Self Care | Payer: MEDICAID | Attending: Emergency Medicine | Admitting: Emergency Medicine

## 2016-04-06 ENCOUNTER — Encounter (HOSPITAL_COMMUNITY): Payer: Self-pay | Admitting: Emergency Medicine

## 2016-04-06 DIAGNOSIS — F17210 Nicotine dependence, cigarettes, uncomplicated: Secondary | ICD-10-CM | POA: Insufficient documentation

## 2016-04-06 DIAGNOSIS — Z79899 Other long term (current) drug therapy: Secondary | ICD-10-CM | POA: Insufficient documentation

## 2016-04-06 DIAGNOSIS — F329 Major depressive disorder, single episode, unspecified: Secondary | ICD-10-CM | POA: Insufficient documentation

## 2016-04-06 DIAGNOSIS — N390 Urinary tract infection, site not specified: Secondary | ICD-10-CM | POA: Insufficient documentation

## 2016-04-06 DIAGNOSIS — K5900 Constipation, unspecified: Secondary | ICD-10-CM | POA: Insufficient documentation

## 2016-04-06 LAB — COMPREHENSIVE METABOLIC PANEL
ALT: 54 U/L (ref 14–54)
AST: 63 U/L — ABNORMAL HIGH (ref 15–41)
Albumin: 3.8 g/dL (ref 3.5–5.0)
Alkaline Phosphatase: 61 U/L (ref 38–126)
Anion gap: 9 (ref 5–15)
BUN: 10 mg/dL (ref 6–20)
CO2: 24 mmol/L (ref 22–32)
Calcium: 9 mg/dL (ref 8.9–10.3)
Chloride: 105 mmol/L (ref 101–111)
Creatinine, Ser: 0.64 mg/dL (ref 0.44–1.00)
GFR calc Af Amer: >60 mL/min (ref >60)
GFR calc non Af Amer: >60 mL/min (ref >60)
Glucose, Bld: 66 mg/dL (ref 65–99)
Potassium: 4 mmol/L (ref 3.5–5.1)
Sodium: 138 mmol/L (ref 135–145)
Total Bilirubin: 0.8 mg/dL (ref 0.3–1.2)
Total Protein: 7.3 g/dL (ref 6.5–8.1)

## 2016-04-06 LAB — CBC
HCT: 40.5 % (ref 36.0–46.0)
Hemoglobin: 13.6 g/dL (ref 12.0–15.0)
MCH: 31.4 pg (ref 26.0–34.0)
MCHC: 33.6 g/dL (ref 30.0–36.0)
MCV: 93.5 fL (ref 78.0–100.0)
Platelets: 270 10*3/uL (ref 150–400)
RBC: 4.33 MIL/uL (ref 3.87–5.11)
RDW: 12 % (ref 11.5–15.5)
WBC: 7.6 10*3/uL (ref 4.0–10.5)

## 2016-04-06 LAB — URINALYSIS, ROUTINE W REFLEX MICROSCOPIC
Bilirubin Urine: NEGATIVE
Glucose, UA: NEGATIVE mg/dL
Ketones, ur: NEGATIVE mg/dL
Nitrite: NEGATIVE
Protein, ur: NEGATIVE mg/dL
Specific Gravity, Urine: 1.017 (ref 1.005–1.030)
pH: 5 (ref 5.0–8.0)

## 2016-04-06 LAB — URINE MICROSCOPIC-ADD ON

## 2016-04-06 LAB — RAPID URINE DRUG SCREEN, HOSP PERFORMED
Amphetamines: POSITIVE — AB
Barbiturates: NOT DETECTED
Benzodiazepines: POSITIVE — AB
Cocaine: POSITIVE — AB
Opiates: POSITIVE — AB
Tetrahydrocannabinol: POSITIVE — AB

## 2016-04-06 LAB — LIPASE, BLOOD: Lipase: 14 U/L (ref 11–51)

## 2016-04-06 MED ORDER — CEPHALEXIN 500 MG PO CAPS
1000.0000 mg | ORAL_CAPSULE | Freq: Once | ORAL | Status: AC
  Administered 2016-04-06: 1000 mg via ORAL
  Filled 2016-04-06: qty 2

## 2016-04-06 MED ORDER — CEPHALEXIN 500 MG PO CAPS: 1000.0000 mg | ORAL_CAPSULE | Freq: Two times a day (BID) | ORAL | Status: DC

## 2016-04-06 MED ORDER — POLYETHYLENE GLYCOL 3350 17 G PO PACK: 17.0000 g | PACK | Freq: Every day | ORAL | Status: AC

## 2016-04-06 MED ORDER — FLEET ENEMA 7-19 GM/118ML RE ENEM: 1.0000 | ENEMA | Freq: Every day | RECTAL | Status: AC | PRN

## 2016-04-06 NOTE — ED Notes (Addendum)
Patient brought from 2W, visiting another patient, reports she was attempting a BM, started sweating and was lightheaded. Denies syncopal episode. Patient reports no BM in two weeks, dysuria and lower abdominal pain. Denies N/V.
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
## 2016-04-06 NOTE — ED Notes (Signed)
Two unsuccessful blood collection attempts made one in each [facility].

## 2016-04-06 NOTE — ED Notes (Signed)
PT REQUEST THAT NO INFORMATION IS RELEASED TO HER MOTHER OR ANYONE ELSE.

## 2016-04-08 LAB — URINE CULTURE

## 2017-06-18 ENCOUNTER — Encounter (HOSPITAL_COMMUNITY): Payer: Self-pay | Admitting: Emergency Medicine

## 2017-06-18 DIAGNOSIS — Z5321 Procedure and treatment not carried out due to patient leaving prior to being seen by health care provider: Secondary | ICD-10-CM | POA: Insufficient documentation

## 2017-06-18 DIAGNOSIS — Y929 Unspecified place or not applicable: Secondary | ICD-10-CM | POA: Insufficient documentation

## 2017-06-18 DIAGNOSIS — Y998 Other external cause status: Secondary | ICD-10-CM | POA: Insufficient documentation

## 2017-06-18 DIAGNOSIS — S0990XA Unspecified injury of head, initial encounter: Secondary | ICD-10-CM | POA: Insufficient documentation

## 2017-06-18 DIAGNOSIS — Y939 Activity, unspecified: Secondary | ICD-10-CM | POA: Insufficient documentation

## 2017-06-18 NOTE — ED Notes (Signed)
Has decided to be seen.  Now c/o neck pain.  Placed soft collar on patient at this time.

## 2017-06-18 NOTE — ED Notes (Signed)
Per waiting room staff patient is back in the waiting room.  Has decided to stay.

## 2017-06-18 NOTE — ED Notes (Signed)
Upon taking patient to waiting room.  Got out of chair and left after calling mother.  States I'm not waiting around for this I am fine."  Encouraged to stay without success.
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
## 2017-06-19 ENCOUNTER — Emergency Department (HOSPITAL_COMMUNITY): Payer: Self-pay

## 2017-06-19 ENCOUNTER — Emergency Department (HOSPITAL_COMMUNITY)
Admission: EM | Admit: 2017-06-19 | Discharge: 2017-06-19 | Disposition: A | Discharge status: Home / Self Care | Payer: Self-pay | Attending: Emergency Medicine | Admitting: Emergency Medicine

## 2017-06-19 NOTE — ED Notes (Signed)
Unable to locate pt in waiting room.  She had stated multiple times she was leaving but then returned.  Looked outside for patient and do not see her.

## 2017-06-19 NOTE — ED Notes (Signed)
Pt keeps going outside and states she no longer wants to be seen and then returns.
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
## 2017-06-19 NOTE — ED Notes (Signed)
Pt is not in waiting room

## 2017-07-14 ENCOUNTER — Emergency Department: Payer: Self-pay

## 2017-07-14 ENCOUNTER — Emergency Department
Admission: EM | Admit: 2017-07-14 | Discharge: 2017-07-14 | Disposition: A | Discharge status: Home / Self Care | Payer: Self-pay | Attending: Emergency Medicine | Admitting: Emergency Medicine

## 2017-07-14 DIAGNOSIS — Z79899 Other long term (current) drug therapy: Secondary | ICD-10-CM | POA: Insufficient documentation

## 2017-07-14 DIAGNOSIS — N7093 Salpingitis and oophoritis, unspecified: Secondary | ICD-10-CM | POA: Insufficient documentation

## 2017-07-14 DIAGNOSIS — R102 Pelvic and perineal pain: Secondary | ICD-10-CM

## 2017-07-14 DIAGNOSIS — F17210 Nicotine dependence, cigarettes, uncomplicated: Secondary | ICD-10-CM | POA: Insufficient documentation

## 2017-07-14 LAB — COMPREHENSIVE METABOLIC PANEL
ALT: 31 U/L (ref 14–54)
AST: 23 U/L (ref 15–41)
Albumin: 3.5 g/dL (ref 3.5–5.0)
Alkaline Phosphatase: 66 U/L (ref 38–126)
Anion gap: 7 (ref 5–15)
BUN: 11 mg/dL (ref 6–20)
CO2: 26 mmol/L (ref 22–32)
Calcium: 8.9 mg/dL (ref 8.9–10.3)
Chloride: 105 mmol/L (ref 101–111)
Creatinine, Ser: 0.8 mg/dL (ref 0.44–1.00)
GFR calc Af Amer: >60 mL/min (ref >60)
GFR calc non Af Amer: >60 mL/min (ref >60)
Glucose, Bld: 93 mg/dL (ref 65–99)
Potassium: 3.4 mmol/L — ABNORMAL LOW (ref 3.5–5.1)
Sodium: 138 mmol/L (ref 135–145)
Total Bilirubin: 0.5 mg/dL (ref 0.3–1.2)
Total Protein: 6.9 g/dL (ref 6.5–8.1)

## 2017-07-14 LAB — URINALYSIS, COMPLETE (UACMP) WITH MICROSCOPIC
Bacteria, UA: NONE SEEN
Bilirubin Urine: NEGATIVE
Glucose, UA: NEGATIVE mg/dL
Ketones, ur: NEGATIVE mg/dL
Leukocytes, UA: NEGATIVE
Nitrite: NEGATIVE
Protein, ur: NEGATIVE mg/dL
Specific Gravity, Urine: 1.023 (ref 1.005–1.030)
pH: 5 (ref 5.0–8.0)

## 2017-07-14 LAB — CBC
HCT: 36 % (ref 35.0–47.0)
Hemoglobin: 12.1 g/dL (ref 12.0–16.0)
MCH: 30.3 pg (ref 26.0–34.0)
MCHC: 33.6 g/dL (ref 32.0–36.0)
MCV: 90.1 fL (ref 80.0–100.0)
Platelets: 310 10*3/uL (ref 150–440)
RBC: 3.99 MIL/uL (ref 3.80–5.20)
RDW: 15.8 % — ABNORMAL HIGH (ref 11.5–14.5)
WBC: 10.6 10*3/uL (ref 3.6–11.0)

## 2017-07-14 LAB — LIPASE, BLOOD: Lipase: 19 U/L (ref 11–51)

## 2017-07-14 LAB — POCT PREGNANCY, URINE: Preg Test, Ur: NEGATIVE

## 2017-07-14 MED ORDER — KETOROLAC TROMETHAMINE 30 MG/ML IJ SOLN
30.0000 mg | Freq: Once | INTRAMUSCULAR | Status: AC
  Administered 2017-07-14: 30 mg via INTRAVENOUS
  Filled 2017-07-14: qty 1

## 2017-07-14 MED ORDER — CLINDAMYCIN HCL 150 MG PO CAPS
150.0000 mg | ORAL_CAPSULE | Freq: Once | ORAL | Status: AC
  Administered 2017-07-14: 150 mg via ORAL
  Filled 2017-07-14: qty 1

## 2017-07-14 MED ORDER — HYDROMORPHONE HCL 1 MG/ML IJ SOLN
1.0000 mg | Freq: Once | INTRAMUSCULAR | Status: AC
  Administered 2017-07-14: 1 mg via INTRAVENOUS
  Filled 2017-07-14: qty 1

## 2017-07-14 MED ORDER — TRAMADOL HCL 50 MG PO TABS
50.0000 mg | ORAL_TABLET | Freq: Once | ORAL | Status: AC
  Administered 2017-07-14: 50 mg via ORAL
  Filled 2017-07-14: qty 1

## 2017-07-14 MED ORDER — CLINDAMYCIN HCL 150 MG PO CAPS: 150.0000 mg | ORAL_CAPSULE | Freq: Four times a day (QID) | ORAL | 0 refills | Status: DC

## 2017-07-14 MED ORDER — IOPAMIDOL (ISOVUE-300) INJECTION 61%
100.0000 mL | Freq: Once | INTRAVENOUS | Status: AC | PRN
  Administered 2017-07-14: 100 mL via INTRAVENOUS
  Filled 2017-07-14: qty 100

## 2017-07-14 MED ORDER — ONDANSETRON 8 MG PO TBDP
8.0000 mg | ORAL_TABLET | Freq: Once | ORAL | Status: AC
  Administered 2017-07-14: 8 mg via ORAL
  Filled 2017-07-14: qty 1

## 2017-07-14 MED ORDER — TRAMADOL HCL 50 MG PO TABS: 50.0000 mg | ORAL_TABLET | Freq: Four times a day (QID) | ORAL | 0 refills | Status: DC | PRN

## 2017-07-14 NOTE — ED Triage Notes (Signed)
Pt states RLQ pain X 3 days, worsening today. Denies NVD. Pt appears fidgity in triage. Pt alert and oriented X4, active, cooperative, pt in NAD. RR even and unlabored, color WNL.
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
## 2017-07-14 NOTE — ED Notes (Signed)
Pt. Verbalizes understanding of d/c instructions, prescriptions, and follow-up. VS stable.Pt. In NAD at time of d/c and denies further concerns regarding this visit. Pt. Stable at the time of departure from the unit, departing unit by the safest and most appropriate manner per that pt condition and limitations. Pt advised to return to the ED at any time for emergent concerns, or for new/worsening symptoms.   

## 2017-07-14 NOTE — ED Notes (Addendum)
See triage note  Presents with lower pain which is mainly on the right side  States pain started several days ago  And pain waxes and wanes  But today the pain is more severe  Positive nausea  No fever or vomiting

## 2017-07-14 NOTE — ED Notes (Signed)
Patient transported to CT 

## 2017-07-14 NOTE — ED Notes (Addendum)
Lab called for help with blood collect, no phlebotomist available

## 2017-07-14 NOTE — ED Notes (Signed)
Pt is difficult stick, heel warmers placed to hands at this time.  IV team consult ordered.

## 2017-07-14 NOTE — ED Notes (Signed)
Attempt for blood draw X 2, unsuccessful.

## 2017-07-14 NOTE — Discharge Instructions (Signed)
Take medications as directed and follow GYN clinic. Call Monday morning to schedule appointment.

## 2017-07-15 ENCOUNTER — Other Ambulatory Visit: Payer: Self-pay | Admitting: Obstetrics and Gynecology

## 2017-07-15 ENCOUNTER — Emergency Department
Admission: EM | Admit: 2017-07-15 | Discharge: 2017-07-15 | Disposition: A | Discharge status: Home / Self Care | Payer: Self-pay | Attending: Emergency Medicine | Admitting: Emergency Medicine

## 2017-07-15 ENCOUNTER — Encounter: Payer: Self-pay | Admitting: Emergency Medicine

## 2017-07-15 DIAGNOSIS — Z79899 Other long term (current) drug therapy: Secondary | ICD-10-CM | POA: Insufficient documentation

## 2017-07-15 DIAGNOSIS — F17210 Nicotine dependence, cigarettes, uncomplicated: Secondary | ICD-10-CM | POA: Insufficient documentation

## 2017-07-15 DIAGNOSIS — R1909 Other intra-abdominal and pelvic swelling, mass and lump: Secondary | ICD-10-CM | POA: Insufficient documentation

## 2017-07-15 DIAGNOSIS — N9489 Other specified conditions associated with female genital organs and menstrual cycle: Secondary | ICD-10-CM

## 2017-07-15 LAB — URINE DRUG SCREEN, QUALITATIVE (ARMC ONLY)
Amphetamines, Ur Screen: NOT DETECTED
Barbiturates, Ur Screen: NOT DETECTED
Benzodiazepine, Ur Scrn: POSITIVE — AB
Cannabinoid 50 Ng, Ur ~~LOC~~: POSITIVE — AB
Cocaine Metabolite,Ur ~~LOC~~: NOT DETECTED
MDMA (Ecstasy)Ur Screen: NOT DETECTED
Methadone Scn, Ur: NOT DETECTED
Opiate, Ur Screen: POSITIVE — AB
Phencyclidine (PCP) Ur S: NOT DETECTED
Tricyclic, Ur Screen: NOT DETECTED

## 2017-07-15 LAB — COMPREHENSIVE METABOLIC PANEL
ALT: 34 U/L (ref 14–54)
AST: 28 U/L (ref 15–41)
Albumin: 3.6 g/dL (ref 3.5–5.0)
Alkaline Phosphatase: 68 U/L (ref 38–126)
Anion gap: 8 (ref 5–15)
BUN: 8 mg/dL (ref 6–20)
CO2: 28 mmol/L (ref 22–32)
Calcium: 8.9 mg/dL (ref 8.9–10.3)
Chloride: 105 mmol/L (ref 101–111)
Creatinine, Ser: 0.77 mg/dL (ref 0.44–1.00)
GFR calc Af Amer: >60 mL/min (ref >60)
GFR calc non Af Amer: >60 mL/min (ref >60)
Glucose, Bld: 84 mg/dL (ref 65–99)
Potassium: 3.7 mmol/L (ref 3.5–5.1)
Sodium: 141 mmol/L (ref 135–145)
Total Bilirubin: 0.3 mg/dL (ref 0.3–1.2)
Total Protein: 7.5 g/dL (ref 6.5–8.1)

## 2017-07-15 LAB — CBC WITH DIFFERENTIAL/PLATELET
Basophils Absolute: 0 10*3/uL (ref 0–0.1)
Basophils Relative: 0 %
Eosinophils Absolute: 0.3 10*3/uL (ref 0–0.7)
Eosinophils Relative: 3 %
HCT: 37.4 % (ref 35.0–47.0)
Hemoglobin: 12.5 g/dL (ref 12.0–16.0)
Lymphocytes Relative: 30 %
Lymphs Abs: 2.9 10*3/uL (ref 1.0–3.6)
MCH: 30.2 pg (ref 26.0–34.0)
MCHC: 33.5 g/dL (ref 32.0–36.0)
MCV: 90.2 fL (ref 80.0–100.0)
Monocytes Absolute: 0.5 10*3/uL (ref 0.2–0.9)
Monocytes Relative: 5 %
Neutro Abs: 5.9 10*3/uL (ref 1.4–6.5)
Neutrophils Relative %: 62 %
Platelets: 339 10*3/uL (ref 150–440)
RBC: 4.14 MIL/uL (ref 3.80–5.20)
RDW: 15.8 % — ABNORMAL HIGH (ref 11.5–14.5)
WBC: 9.6 10*3/uL (ref 3.6–11.0)

## 2017-07-15 MED ORDER — OXYCODONE-ACETAMINOPHEN 5-325 MG PO TABS: 1.0000 | ORAL_TABLET | Freq: Four times a day (QID) | ORAL | 0 refills | Status: DC | PRN

## 2017-07-15 MED ORDER — SODIUM CHLORIDE 0.9 % IV BOLUS (SEPSIS)
1000.0000 mL | Freq: Once | INTRAVENOUS | Status: AC
  Administered 2017-07-15: 1000 mL via INTRAVENOUS

## 2017-07-15 MED ORDER — FENTANYL CITRATE (PF) 100 MCG/2ML IJ SOLN
50.0000 ug | Freq: Once | INTRAMUSCULAR | Status: AC
  Administered 2017-07-15: 50 ug via INTRAVENOUS
  Filled 2017-07-15: qty 2

## 2017-07-15 MED ORDER — ONDANSETRON HCL 4 MG/2ML IJ SOLN
4.0000 mg | Freq: Once | INTRAMUSCULAR | Status: AC
  Administered 2017-07-15: 4 mg via INTRAVENOUS
  Filled 2017-07-15: qty 2

## 2017-07-15 MED ORDER — OXYCODONE-ACETAMINOPHEN 5-325 MG PO TABS
1.0000 | ORAL_TABLET | Freq: Once | ORAL | Status: AC
  Administered 2017-07-15: 1 via ORAL
  Filled 2017-07-15: qty 1
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
## 2017-07-15 NOTE — ED Notes (Signed)
Labs sent and pt undressed in gown. Pelvic cart set up and placed at bedside. Pt given warm blanket.

## 2017-07-15 NOTE — ED Notes (Signed)
Patient is a call back to be evaluated by OBGYN for an ovarian abscess.
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
## 2017-07-15 NOTE — ED Triage Notes (Signed)
Pt was seen in ED yesterday for abscess on ovary, was called back today to be reevaluated for possible admission per [employer] , [place] .

## 2017-07-15 NOTE — ED Provider Notes (Signed)
The patient called our emergency department stating that she was unable to afford her prescription for clindamycin prescribed yesterday.  On chart review the patient was diagnosed with a tubo-ovarian abscess and prescribed antibiotics and treated with out patient management. I'm concerned that as the patient is unable to afford her medications this may progress. We will call the patient back for further evaluation and possible OB gynecology consultation.   Darel Hong, MD 07/15/17 1433

## 2017-07-17 ENCOUNTER — Ambulatory Visit
Admission: RE | Admit: 2017-07-17 | Discharge: 2017-07-17 | Disposition: A | Discharge status: Home / Self Care | Payer: Self-pay | Source: Ambulatory Visit | Attending: Obstetrics and Gynecology | Admitting: Obstetrics and Gynecology

## 2017-07-17 ENCOUNTER — Encounter: Payer: Self-pay | Admitting: *Deleted

## 2017-07-17 ENCOUNTER — Encounter
Admission: RE | Disposition: A | Discharge status: Home / Self Care | Payer: Self-pay | Source: Ambulatory Visit | Attending: Obstetrics and Gynecology

## 2017-07-17 ENCOUNTER — Ambulatory Visit: Payer: Self-pay | Admitting: Certified Registered Nurse Anesthetist

## 2017-07-17 DIAGNOSIS — G8929 Other chronic pain: Secondary | ICD-10-CM | POA: Insufficient documentation

## 2017-07-17 DIAGNOSIS — Z885 Allergy status to narcotic agent status: Secondary | ICD-10-CM | POA: Insufficient documentation

## 2017-07-17 DIAGNOSIS — M545 Low back pain: Secondary | ICD-10-CM | POA: Insufficient documentation

## 2017-07-17 DIAGNOSIS — F419 Anxiety disorder, unspecified: Secondary | ICD-10-CM | POA: Insufficient documentation

## 2017-07-17 DIAGNOSIS — N7093 Salpingitis and oophoritis, unspecified: Secondary | ICD-10-CM

## 2017-07-17 DIAGNOSIS — F17210 Nicotine dependence, cigarettes, uncomplicated: Secondary | ICD-10-CM | POA: Insufficient documentation

## 2017-07-17 DIAGNOSIS — Z881 Allergy status to other antibiotic agents status: Secondary | ICD-10-CM | POA: Insufficient documentation

## 2017-07-17 DIAGNOSIS — N808 Other endometriosis: Secondary | ICD-10-CM | POA: Insufficient documentation

## 2017-07-17 DIAGNOSIS — Z9889 Other specified postprocedural states: Secondary | ICD-10-CM

## 2017-07-17 HISTORY — PX: LAPAROSCOPIC SALPINGO OOPHERECTOMY: SHX5927

## 2017-07-17 LAB — CBC WITH DIFFERENTIAL/PLATELET
Basophils Absolute: 0 10*3/uL (ref 0–0.1)
Basophils Relative: 0 %
Eosinophils Absolute: 0.2 10*3/uL (ref 0–0.7)
Eosinophils Relative: 1 %
HCT: 35.7 % (ref 35.0–47.0)
Hemoglobin: 11.9 g/dL — ABNORMAL LOW (ref 12.0–16.0)
Lymphocytes Relative: 11 %
Lymphs Abs: 1.6 10*3/uL (ref 1.0–3.6)
MCH: 29.4 pg (ref 26.0–34.0)
MCHC: 33.3 g/dL (ref 32.0–36.0)
MCV: 88.3 fL (ref 80.0–100.0)
Monocytes Absolute: 0.5 10*3/uL (ref 0.2–0.9)
Monocytes Relative: 3 %
Neutro Abs: 12.4 10*3/uL — ABNORMAL HIGH (ref 1.4–6.5)
Neutrophils Relative %: 85 %
Platelets: 384 10*3/uL (ref 150–440)
RBC: 4.05 MIL/uL (ref 3.80–5.20)
RDW: 15.7 % — ABNORMAL HIGH (ref 11.5–14.5)
WBC: 14.6 10*3/uL — ABNORMAL HIGH (ref 3.6–11.0)

## 2017-07-17 LAB — URINE DRUG SCREEN, QUALITATIVE (ARMC ONLY)
Amphetamines, Ur Screen: NOT DETECTED
Barbiturates, Ur Screen: NOT DETECTED
Benzodiazepine, Ur Scrn: POSITIVE — AB
Cannabinoid 50 Ng, Ur ~~LOC~~: POSITIVE — AB
Cocaine Metabolite,Ur ~~LOC~~: NOT DETECTED
MDMA (Ecstasy)Ur Screen: NOT DETECTED
Methadone Scn, Ur: NOT DETECTED
Opiate, Ur Screen: POSITIVE — AB
Phencyclidine (PCP) Ur S: NOT DETECTED
Tricyclic, Ur Screen: NOT DETECTED

## 2017-07-17 LAB — TYPE AND SCREEN
ABO/RH(D): A NEG
Antibody Screen: NEGATIVE

## 2017-07-17 LAB — POCT PREGNANCY, URINE: Preg Test, Ur: NEGATIVE

## 2017-07-17 LAB — RAPID HIV SCREEN (HIV 1/2 AB+AG)
HIV 1/2 Antibodies: NONREACTIVE
HIV-1 P24 Antigen - HIV24: NONREACTIVE

## 2017-07-17 SURGERY — SALPINGO-OOPHORECTOMY, LAPAROSCOPIC
Anesthesia: General | Laterality: Bilateral | Wound class: Dirty or Infected

## 2017-07-17 MED ORDER — SEVOFLURANE IN SOLN
RESPIRATORY_TRACT | Status: AC
  Filled 2017-07-17: qty 250

## 2017-07-17 MED ORDER — LIDOCAINE HCL (CARDIAC) 20 MG/ML IV SOLN
INTRAVENOUS | Status: DC | PRN
  Administered 2017-07-17: 80 mg via INTRAVENOUS

## 2017-07-17 MED ORDER — ROCURONIUM BROMIDE 100 MG/10ML IV SOLN
INTRAVENOUS | Status: DC | PRN
  Administered 2017-07-17: 30 mg via INTRAVENOUS
  Administered 2017-07-17: 10 mg via INTRAVENOUS
  Administered 2017-07-17: 20 mg via INTRAVENOUS

## 2017-07-17 MED ORDER — ONDANSETRON HCL 4 MG/2ML IJ SOLN
INTRAMUSCULAR | Status: AC
  Filled 2017-07-17: qty 2

## 2017-07-17 MED ORDER — TRAMADOL HCL 50 MG PO TABS
ORAL_TABLET | ORAL | Status: AC
  Filled 2017-07-17: qty 1

## 2017-07-17 MED ORDER — FENTANYL CITRATE (PF) 100 MCG/2ML IJ SOLN
INTRAMUSCULAR | Status: DC | PRN
  Administered 2017-07-17 (×4): 50 ug via INTRAVENOUS

## 2017-07-17 MED ORDER — GLYCOPYRROLATE 0.2 MG/ML IJ SOLN
INTRAMUSCULAR | Status: DC | PRN
  Administered 2017-07-17: 0.2 mg via INTRAVENOUS

## 2017-07-17 MED ORDER — SUGAMMADEX SODIUM 200 MG/2ML IV SOLN
INTRAVENOUS | Status: AC
  Filled 2017-07-17: qty 2

## 2017-07-17 MED ORDER — HYDROMORPHONE HCL 1 MG/ML IJ SOLN
0.2500 mg | INTRAMUSCULAR | Status: DC | PRN
  Administered 2017-07-17 (×4): 0.5 mg via INTRAVENOUS

## 2017-07-17 MED ORDER — TRAMADOL HCL 50 MG PO TABS: 50.0000 mg | ORAL_TABLET | Freq: Four times a day (QID) | ORAL | 0 refills | Status: DC | PRN

## 2017-07-17 MED ORDER — ACETAMINOPHEN 10 MG/ML IV SOLN
INTRAVENOUS | Status: AC
  Filled 2017-07-17: qty 100

## 2017-07-17 MED ORDER — METRONIDAZOLE 500 MG PO TABS: 500.0000 mg | ORAL_TABLET | Freq: Two times a day (BID) | ORAL | 0 refills | Status: AC

## 2017-07-17 MED ORDER — FENTANYL CITRATE (PF) 100 MCG/2ML IJ SOLN
INTRAMUSCULAR | Status: AC
  Filled 2017-07-17: qty 2

## 2017-07-17 MED ORDER — TRAMADOL HCL 50 MG PO TABS
ORAL_TABLET | ORAL | Status: AC
  Administered 2017-07-17: 50 mg via ORAL
  Filled 2017-07-17: qty 1

## 2017-07-17 MED ORDER — LIDOCAINE HCL (PF) 2 % IJ SOLN
INTRAMUSCULAR | Status: AC
  Filled 2017-07-17: qty 4

## 2017-07-17 MED ORDER — CEFAZOLIN SODIUM-DEXTROSE 2-4 GM/100ML-% IV SOLN
INTRAVENOUS | Status: AC
  Filled 2017-07-17: qty 100

## 2017-07-17 MED ORDER — DEXAMETHASONE SODIUM PHOSPHATE 10 MG/ML IJ SOLN
INTRAMUSCULAR | Status: AC
  Filled 2017-07-17: qty 1

## 2017-07-17 MED ORDER — KETOROLAC TROMETHAMINE 30 MG/ML IJ SOLN
INTRAMUSCULAR | Status: DC | PRN
  Administered 2017-07-17: 30 mg via INTRAVENOUS

## 2017-07-17 MED ORDER — IBUPROFEN 800 MG PO TABS: 800.0000 mg | ORAL_TABLET | Freq: Three times a day (TID) | ORAL | 0 refills | Status: AC

## 2017-07-17 MED ORDER — ONDANSETRON HCL 4 MG/2ML IJ SOLN
INTRAMUSCULAR | Status: DC | PRN
Start: 2017-07-17 — End: 2017-07-17
  Administered 2017-07-17: 4 mg via INTRAVENOUS

## 2017-07-17 MED ORDER — TRAMADOL HCL 50 MG PO TABS
50.0000 mg | ORAL_TABLET | Freq: Four times a day (QID) | ORAL | Status: DC
  Administered 2017-07-17: 50 mg via ORAL

## 2017-07-17 MED ORDER — HYDROMORPHONE HCL 1 MG/ML IJ SOLN
INTRAMUSCULAR | Status: AC
  Administered 2017-07-17: 0.5 mg via INTRAVENOUS
  Filled 2017-07-17: qty 1

## 2017-07-17 MED ORDER — GLYCOPYRROLATE 0.2 MG/ML IJ SOLN
INTRAMUSCULAR | Status: AC
Start: 2017-07-17 — End: 2017-07-17
  Filled 2017-07-17: qty 1

## 2017-07-17 MED ORDER — ACETAMINOPHEN 10 MG/ML IV SOLN
INTRAVENOUS | Status: DC | PRN
  Administered 2017-07-17: 1000 mg via INTRAVENOUS

## 2017-07-17 MED ORDER — FAMOTIDINE 20 MG PO TABS
20.0000 mg | ORAL_TABLET | Freq: Once | ORAL | Status: AC
  Administered 2017-07-17: 20 mg via ORAL

## 2017-07-17 MED ORDER — CEFTRIAXONE SODIUM 1 G IJ SOLR
INTRAMUSCULAR | Status: AC
  Filled 2017-07-17: qty 10

## 2017-07-17 MED ORDER — SUGAMMADEX SODIUM 200 MG/2ML IV SOLN
INTRAVENOUS | Status: DC | PRN
Start: 2017-07-17 — End: 2017-07-17
  Administered 2017-07-17: 130 mg via INTRAVENOUS

## 2017-07-17 MED ORDER — FENTANYL CITRATE (PF) 100 MCG/2ML IJ SOLN
INTRAMUSCULAR | Status: AC
  Administered 2017-07-17: 25 ug via INTRAVENOUS
  Filled 2017-07-17: qty 2

## 2017-07-17 MED ORDER — MIDAZOLAM HCL 2 MG/2ML IJ SOLN
INTRAMUSCULAR | Status: DC | PRN
  Administered 2017-07-17: 2 mg via INTRAVENOUS

## 2017-07-17 MED ORDER — ROCURONIUM BROMIDE 50 MG/5ML IV SOLN
INTRAVENOUS | Status: AC
  Filled 2017-07-17: qty 1

## 2017-07-17 MED ORDER — LACTATED RINGERS IV SOLN
INTRAVENOUS | Status: DC
  Administered 2017-07-17 (×2): via INTRAVENOUS

## 2017-07-17 MED ORDER — MIDAZOLAM HCL 2 MG/2ML IJ SOLN
INTRAMUSCULAR | Status: AC
  Filled 2017-07-17: qty 2

## 2017-07-17 MED ORDER — PROPOFOL 10 MG/ML IV BOLUS
INTRAVENOUS | Status: DC | PRN
  Administered 2017-07-17: 150 mg via INTRAVENOUS

## 2017-07-17 MED ORDER — FENTANYL CITRATE (PF) 100 MCG/2ML IJ SOLN
25.0000 ug | INTRAMUSCULAR | Status: AC | PRN
  Administered 2017-07-17 (×6): 25 ug via INTRAVENOUS

## 2017-07-17 MED ORDER — TRAMADOL HCL 50 MG PO TABS
50.0000 mg | ORAL_TABLET | Freq: Four times a day (QID) | ORAL | Status: AC
  Administered 2017-07-17: 50 mg via ORAL

## 2017-07-17 MED ORDER — KETOROLAC TROMETHAMINE 30 MG/ML IJ SOLN
INTRAMUSCULAR | Status: AC
  Filled 2017-07-17: qty 1

## 2017-07-17 MED ORDER — ONDANSETRON HCL 4 MG/2ML IJ SOLN: 4.0000 mg | Freq: Once | INTRAMUSCULAR | Status: DC | PRN

## 2017-07-17 MED ORDER — CEFAZOLIN SODIUM-DEXTROSE 2-4 GM/100ML-% IV SOLN
2.0000 g | INTRAVENOUS | Status: AC
  Administered 2017-07-17: 2 g via INTRAVENOUS

## 2017-07-17 MED ORDER — FAMOTIDINE 20 MG PO TABS
ORAL_TABLET | ORAL | Status: AC
  Administered 2017-07-17: 20 mg via ORAL
  Filled 2017-07-17: qty 1

## 2017-07-17 MED ORDER — PROPOFOL 10 MG/ML IV BOLUS
INTRAVENOUS | Status: AC
  Filled 2017-07-17: qty 20

## 2017-07-17 MED ORDER — DEXAMETHASONE SODIUM PHOSPHATE 10 MG/ML IJ SOLN
INTRAMUSCULAR | Status: DC | PRN
  Administered 2017-07-17: 10 mg via INTRAVENOUS

## 2017-07-17 MED ORDER — DEXTROSE 5 % IV SOLN
INTRAVENOUS | Status: DC | PRN
  Administered 2017-07-17: 1 g via INTRAVENOUS

## 2017-07-17 SURGICAL SUPPLY — 41 items
ADH SKN CLS APL DERMABOND .7 (GAUZE/BANDAGES/DRESSINGS)
ANCHOR TIS RET SYS 235ML (MISCELLANEOUS) IMPLANT
BAG TISS RTRVL C235 10X14 (MISCELLANEOUS)
BLADE SURG SZ11 CARB STEEL (BLADE) ×3 IMPLANT
CANISTER SUCT 1200ML W/VALVE (MISCELLANEOUS) ×3 IMPLANT
CATH ROBINSON RED A/P 16FR (CATHETERS) ×3 IMPLANT
CHLORAPREP W/TINT 26ML (MISCELLANEOUS) ×3 IMPLANT
DERMABOND ADVANCED (GAUZE/BANDAGES/DRESSINGS)
DERMABOND ADVANCED .7 DNX12 (GAUZE/BANDAGES/DRESSINGS) IMPLANT
DRSG TEGADERM 2-3/8X2-3/4 SM (GAUZE/BANDAGES/DRESSINGS) ×9 IMPLANT
GLOVE BIO SURGEON STRL SZ8 (GLOVE) ×6 IMPLANT
GLOVE INDICATOR 8.0 STRL GRN (GLOVE) ×3 IMPLANT
GOWN STRL REUS W/ TWL LRG LVL3 (GOWN DISPOSABLE) ×2 IMPLANT
GOWN STRL REUS W/ TWL XL LVL3 (GOWN DISPOSABLE) ×1 IMPLANT
GOWN STRL REUS W/TWL LRG LVL3 (GOWN DISPOSABLE) ×6
GOWN STRL REUS W/TWL XL LVL3 (GOWN DISPOSABLE) ×3
GRASPER SUT TROCAR 14GX15 (MISCELLANEOUS) ×2 IMPLANT
IRRIGATION STRYKERFLOW (MISCELLANEOUS) IMPLANT
IRRIGATOR STRYKERFLOW (MISCELLANEOUS)
IV LACTATED RINGERS 1000ML (IV SOLUTION) ×3 IMPLANT
KIT PINK PAD W/HEAD ARE REST (MISCELLANEOUS) ×3
KIT PINK PAD W/HEAD ARM REST (MISCELLANEOUS) ×1 IMPLANT
KIT RM TURNOVER CYSTO AR (KITS) ×3 IMPLANT
LABEL OR SOLS (LABEL) ×3 IMPLANT
NS IRRIG 1000ML POUR BTL (IV SOLUTION) ×3 IMPLANT
NS IRRIG 500ML POUR BTL (IV SOLUTION) ×3 IMPLANT
PACK GYN LAPAROSCOPIC (MISCELLANEOUS) ×3 IMPLANT
PAD OB MATERNITY 4.3X12.25 (PERSONAL CARE ITEMS) ×3 IMPLANT
PAD PREP 24X41 OB/GYN DISP (PERSONAL CARE ITEMS) ×3 IMPLANT
POUCH ENDO CATCH 10MM SPEC (MISCELLANEOUS) IMPLANT
SCALPEL HARMONIC ACE (MISCELLANEOUS) ×2 IMPLANT
SCISSORS METZENBAUM CVD 33 (INSTRUMENTS) IMPLANT
SHEARS HARMONIC ACE PLUS 36CM (ENDOMECHANICALS) IMPLANT
SLEEVE ENDOPATH XCEL 5M (ENDOMECHANICALS) ×2 IMPLANT
SUT MNCRL 4-0 (SUTURE) ×6
SUT MNCRL 4-0 27XMFL (SUTURE) ×2
SUT VIC AB 0 UR5 27 (SUTURE) ×5 IMPLANT
SUTURE MNCRL 4-0 27XMF (SUTURE) ×1 IMPLANT
TROCAR ENDO BLADELESS 11MM (ENDOMECHANICALS) IMPLANT
TROCAR XCEL NON-BLD 5MMX100MML (ENDOMECHANICALS) ×3 IMPLANT
TUBING INSUFFLATOR HI FLOW (MISCELLANEOUS) ×3 IMPLANT

## 2017-07-17 NOTE — Anesthesia Post-op Follow-up Note (Signed)
Anesthesia QCDR form completed.        

## 2017-07-17 NOTE — Progress Notes (Signed)
Up to [facility].

## 2017-07-17 NOTE — Op Note (Addendum)
OPERATIVE NOTE:  [name] PROCEDURE DATE: 07/17/2017   PREOPERATIVE DIAGNOSIS:  1. Acute right lower quadrant pain 2. Right adnexal mass  POSTOPERATIVE DIAGNOSIS:  1. Acute right lower quadrant pain 2. Right tubo-ovarian abscess 3. Fitz-Hugh Curtis syndrome 4. Endometriosis  PROCEDURE:  Laparoscopic right oophorectomy and bilateral salpingectomy   SURGEON:  Brayton Mars, MD ASSISTANTS: Jeannie Fend M.D., and PA-S Steffanie Dunn Edmiston ANESTHESIA: General INDICATIONS: 36 y.o. G2P200 2 status post BTL for contraception, history of cesarean section 2, presents for surgical evaluation and management of a dramatic right adnexal mass. Workup in the emergency room demonstrated a complex right adnexal mass measuring 5.7 cm in diameter suspicious for possible tubo-ovarian abscess. Patient had normal white blood cell count and was afebrile; because of persistent pain, acute and severe, patient presents for definitive surgical management.   FINDINGS:   Right TOA complex measuring approximately 5 cm; there were omental adhesions over the tube/ovarian abscess complex. The mass was stuck to the anterior abdominal wall. There was evidence of pus left adnexa and cul-de-sac. Upper abdomen was notable for [employer] syndrome findings with multiple thin and thick adhesion bands between the liver and anterior abdominal wall. The left tube and ovary were normal. Uterus was normal. There were powder burn implants in the cul-de-sac consistent with endometriosis   I/O's: Total I/O In: 1000 [I.V.:1000] Out: 125 [Urine:125] COUNTS:  YES SPECIMENS: Bilateral fallopian tubes and right ovary; aerobic and anaerobic cultures of peritoneal fluid ANTIBIOTIC PROPHYLAXIS:Rocephin 1 g and Ancef 2 grams COMPLICATIONS: None immediate  PROCEDURE IN DETAIL: Patient was brought to the operating room and placed in the supine position. General endotracheal anesthesia was difficulty. She was placed in the  dorsal lithotomy position using the bumblebee stirrups. A ChloraPrep and Hibiclens abdominal perineal intravaginal prep and drape was performed in standard fashion. Timeout was completed. Red Robison catheter was used to drain 125 mL of urine from the bladder. A Hulka tenaculum was placed onto the cervix to facilitate uterine manipulation. Subumbilical vertical incision 5 mm in length was made. The Optiview laparoscopic trocar system was used to place a 5 mm port directly into the abdominal pelvic cavity without evidence of bowel or vascular injury. Subsequently 2 other 5 mm ports were placed in the left lower quadrant and suprapubic region, and an 11 mm port was placed in the right mid abdominal pelvic wall region. These were placed under direct visualization without evidence of bowel or vascular injury. The Ace Harmonic scalpel along with graspers was used to perform adhesiolysis with the omentum being taken down from the anterior abdominal wall and the right adnexal complex mass. After identifying location of ureters, the right salpingo-oophorectomy was performed with the assistance of the Ace Harmonic scalpel as well as graspers. The infundibulopelvic ligament was clamped coagulated and transected. The mesosalpinx was likewise clamped coagulated and transected in a linear fashion to mobilize and remove the adnexal structures. The structures were taken out with the aid of an Endo Catch bag system. The left fallopian tube was isolated and likewise removed using the Ace Harmonic scalpel and graspers. The mesosalpinx was sequentially clamped coagulated and cut. The mobilize structure was removed from the pelvis through the 11 mm port. The pelvis was copiously irrigated with 1.5 L of normal saline. This irrigant fluid was aspirated. Hemostasis was accomplished with additional Kleppinger bipolar cautery in the right adnexal region to optimize hemostasis. Inspection of the upper abdomen demonstrated [employer]  syndrome. Following satisfactory inspection of the abdomen and  pelvis, the the 11 mm port was closed with the Carter-Thomason ligature closure system. 2-0 Vicryl sutures were used to close the 11 mm port. All ports were then removed from the abdominal pelvic cavity; pneumoperitoneum was released. The skin incisions were closed with 4-0 Monocryl suture. Dermabond glue was placed over the incisions. The patient did receive Ancef 2 g prophylaxis at the start of the case. She also received Rocephin 1 g intraoperatively because of the inflammatory nature of the abnormality. At completion of surgery instrumentation was removed, and patient was mobilized And taken to recovery in satisfactory condition.  Diane Sebesta A. Zipporah Plants, MD, ACOG [facility]   ADDENDUM: Upon initial entry into the pelvic cavity, 25 cc of purulent peritoneal fluid was aspirated and cultured. The peritoneal fluid was obtained in the left adnexal region and cul-de-sac

## 2017-07-17 NOTE — Progress Notes (Signed)
Peri pad dry.

## 2017-07-17 NOTE — OR Nursing (Signed)
Dr. Baruch Merl notified of today's drug screen results and he reports it is okay to proceed with surgery.
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
## 2017-07-17 NOTE — Anesthesia Procedure Notes (Signed)
Procedure Name: Intubation Date/Time: 07/17/2017 10:34 AM Performed by: Johnna Acosta Pre-anesthesia Checklist: Patient identified, Emergency Drugs available, Suction available, Patient being monitored and Timeout performed Patient Re-evaluated:Patient Re-evaluated prior to induction Oxygen Delivery Method: Circle system utilized Preoxygenation: Pre-oxygenation with 100% oxygen Induction Type: IV induction Ventilation: Mask ventilation without difficulty Laryngoscope Size: Miller and 2 Grade View: Grade I Tube type: Oral Tube size: 7.0 mm Number of attempts: 1 Airway Equipment and Method: Stylet Placement Confirmation: ETT inserted through vocal cords under direct vision,  positive ETCO2 and breath sounds checked- equal and bilateral Secured at: 21 cm Tube secured with: Tape Dental Injury: Teeth and Oropharynx as per pre-operative assessment  Difficulty Due To: Difficulty was unanticipated

## 2017-07-17 NOTE — Progress Notes (Signed)
Pt insisted on going home now  Even though pain is the same

## 2017-07-17 NOTE — Interval H&P Note (Signed)
History and Physical Interval Note:  07/17/2017 9:40 AM  [name]  has presented today for surgery, with the diagnosis of right lower quadrant pain  The various methods of treatment have been discussed with the patient and family. After consideration of risks, benefits and other options for treatment, the patient has consented to  Procedure(s): DIAGNOSTIC LAPAROSCOPY POSSIBLE RIGHT SALPINGO-OOPHORECTOMY (Right) as a surgical intervention .  The patient's history has been reviewed, patient examined, no change in status, stable for surgery.  I have reviewed the patient's chart and labs.  Questions were answered to the patient's satisfaction.     Hassell Done A Aeriana Speece
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
## 2017-07-17 NOTE — Progress Notes (Signed)
No notable drainage on peri pad

## 2017-07-17 NOTE — Transfer of Care (Signed)
Immediate Anesthesia Transfer of Care Note  Patient: [name]  Procedure(s) Performed: DIAGNOSTIC LAPAROSCOPY  RIGHT SALPINGO-OOPHORECTOMY (Bilateral )  Patient Location: PACU  Anesthesia Type:General  Level of Consciousness: awake and alert   Airway & Oxygen Therapy: Patient Spontanous Breathing and Patient connected to face mask oxygen  Post-op Assessment: Report given to RN and Post -op Vital signs reviewed and stable  Post vital signs: Reviewed and stable  Last Vitals:  Vitals:   07/17/17 0859  BP: 110/68  Pulse: 100  Resp: 16  Temp: (!) 36.4 C  SpO2: 100%    Last Pain:  Vitals:   07/17/17 0859  TempSrc: Tympanic  PainSc: 10-Worst pain ever      Patients Stated Pain Goal: 2 (84/53/64 6803)  Complications: No apparent anesthesia complications

## 2017-07-17 NOTE — Anesthesia Postprocedure Evaluation (Signed)
Anesthesia Post Note  Patient: Clella Mckeel  Procedure(s) Performed: DIAGNOSTIC LAPAROSCOPY  RIGHT SALPINGO-OOPHORECTOMY (Bilateral )  Patient location during evaluation: PACU Anesthesia Type: General Level of consciousness: awake and alert Pain management: pain level controlled Vital Signs Assessment: post-procedure vital signs reviewed and stable Respiratory status: spontaneous breathing, nonlabored ventilation, respiratory function stable and patient connected to nasal cannula oxygen Cardiovascular status: blood pressure returned to baseline and stable Postop Assessment: no apparent nausea or vomiting Anesthetic complications: no     Last Vitals:  Vitals:   07/17/17 1215 07/17/17 1220  BP: 106/62   Pulse: 93 90  Resp: 17 17  Temp: (!) 36.2 C   SpO2: 100% 100%    Last Pain:  Vitals:   07/17/17 1215  TempSrc: Temporal  PainSc:                  Molli Barrows

## 2017-07-18 ENCOUNTER — Telehealth: Payer: Self-pay | Admitting: Obstetrics and Gynecology

## 2017-07-18 ENCOUNTER — Encounter: Payer: Self-pay | Admitting: Obstetrics and Gynecology

## 2017-07-18 LAB — SURGICAL PATHOLOGY

## 2017-07-18 LAB — RPR: RPR Ser Ql: NONREACTIVE

## 2017-07-18 MED ORDER — OXYCODONE-ACETAMINOPHEN 5-325 MG PO TABS: 1.0000 | ORAL_TABLET | Freq: Four times a day (QID) | ORAL | 0 refills | Status: DC | PRN

## 2017-07-18 NOTE — Telephone Encounter (Signed)
Pt is s/p 1 day lap rt oophorectomy and bs. Given tramadol and ibup for pain. Pt states that's not working for her. She has taken tramadol x3 since 8 am and aleve x 2 with minimal relief of pain. B/B- wnl. Pos eating and drinking. NO fever. Per mad ok to give percocet #20 only. Per pt no allergy to percocet. Pt aware after percocet  She will have to manage her pain with tramadol and ibup. Pt voices understanding. Rx left up front for p/u.

## 2017-07-18 NOTE — Telephone Encounter (Signed)
Patient called and stated that she has surgery yesterday, and Tramadol is not working or helping with her pain. The patient was requesting a stronger pain medication, And a call back from a nurse to discuss her problems and concerns. No other information was disclosed. Please advise.

## 2017-07-19 ENCOUNTER — Telehealth: Payer: Self-pay | Admitting: Obstetrics and Gynecology

## 2017-07-19 NOTE — Telephone Encounter (Signed)
Pt aware note up front for p/u. Mad ok for pt to return to work on 07/20/2017.

## 2017-07-19 NOTE — Telephone Encounter (Signed)
The patient called and stated that she would like to return to work tomorrow and that she would also like to know what all she needs to do, or have done in order for her to return to work. The patient is extremely concerned and would like to speak with a nurse as soon as possible to discuss her concerns and questions, Patient would like both contact numbers to be tried. Please advise.

## 2017-07-20 LAB — CULTURE, BLOOD (ROUTINE X 2)
Culture: NO GROWTH
Culture: NO GROWTH
Special Requests: ADEQUATE
Special Requests: ADEQUATE

## 2017-07-20 LAB — AEROBIC CULTURE W GRAM STAIN (SUPERFICIAL SPECIMEN)

## 2017-07-20 LAB — AEROBIC CULTURE  (SUPERFICIAL SPECIMEN)

## 2017-07-22 LAB — ANAEROBIC CULTURE

## 2017-07-25 ENCOUNTER — Telehealth: Payer: Self-pay | Admitting: Obstetrics and Gynecology

## 2017-07-25 ENCOUNTER — Encounter: Payer: Self-pay | Admitting: Obstetrics and Gynecology

## 2017-07-25 NOTE — Telephone Encounter (Signed)
Patient is out of pain medication and couldn't come for appointment today due to work and she doesn't know when she can come - could you give her more pain medication??  Please call

## 2017-07-27 NOTE — Telephone Encounter (Signed)
lmtrc

## 2017-07-31 NOTE — Telephone Encounter (Signed)
LMTRC

## 2020-05-08 ENCOUNTER — Encounter (HOSPITAL_COMMUNITY): Payer: Self-pay | Admitting: *Deleted

## 2020-05-08 ENCOUNTER — Other Ambulatory Visit: Payer: Self-pay

## 2020-05-08 ENCOUNTER — Emergency Department (HOSPITAL_COMMUNITY)
Admission: EM | Admit: 2020-05-08 | Discharge: 2020-05-08 | Disposition: A | Discharge status: Home / Self Care | Payer: Self-pay | Attending: Emergency Medicine | Admitting: Emergency Medicine

## 2020-05-08 DIAGNOSIS — B86 Scabies: Secondary | ICD-10-CM | POA: Insufficient documentation

## 2020-05-08 DIAGNOSIS — F17210 Nicotine dependence, cigarettes, uncomplicated: Secondary | ICD-10-CM | POA: Insufficient documentation

## 2020-05-08 MED ORDER — PERMETHRIN 5 % EX CREA: TOPICAL_CREAM | CUTANEOUS | 1 refills | Status: DC
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
## 2020-05-08 NOTE — Discharge Instructions (Addendum)
Follow up if not improving

## 2020-05-08 NOTE — ED Triage Notes (Signed)
Pt states she had a girl stay with her for a few days and 2 days later pt started having red bumps all over skin; pt states she see little black bugs

## 2023-07-24 ENCOUNTER — Encounter (HOSPITAL_COMMUNITY): Payer: Self-pay

## 2023-07-24 ENCOUNTER — Emergency Department (HOSPITAL_COMMUNITY)
Admission: EM | Admit: 2023-07-24 | Discharge: 2023-07-25 | Disposition: A | Discharge status: Home / Self Care | Payer: BLUE CROSS/BLUE SHIELD | Attending: Emergency Medicine | Admitting: Emergency Medicine

## 2023-07-24 ENCOUNTER — Other Ambulatory Visit: Payer: Self-pay

## 2023-07-24 DIAGNOSIS — R21 Rash and other nonspecific skin eruption: Secondary | ICD-10-CM | POA: Diagnosis present

## 2023-07-24 NOTE — ED Triage Notes (Signed)
Patient complaining of feeling something moving under her skin. She has pick marks all over her arms and legs. Started two days ago.

## 2023-07-25 DIAGNOSIS — R21 Rash and other nonspecific skin eruption: Secondary | ICD-10-CM | POA: Diagnosis not present

## 2023-07-25 MED ORDER — PERMETHRIN 5 % EX CREA: TOPICAL_CREAM | CUTANEOUS | 0 refills | Status: DC

## 2023-07-25 MED ORDER — SULFAMETHOXAZOLE-TRIMETHOPRIM 800-160 MG PO TABS: 1.0000 | ORAL_TABLET | Freq: Two times a day (BID) | ORAL | 0 refills | Status: AC

## 2023-07-25 MED ORDER — SULFAMETHOXAZOLE-TRIMETHOPRIM 800-160 MG PO TABS
1.0000 | ORAL_TABLET | Freq: Once | ORAL | Status: AC
  Administered 2023-07-25: 1 via ORAL
  Filled 2023-07-25: qty 1

## 2023-07-25 MED ORDER — HYDROXYZINE HCL 25 MG PO TABS
25.0000 mg | ORAL_TABLET | Freq: Once | ORAL | Status: AC
  Administered 2023-07-25: 25 mg via ORAL
  Filled 2023-07-25: qty 1

## 2023-07-25 MED ORDER — HYDROXYZINE HCL 25 MG PO TABS: 25.0000 mg | ORAL_TABLET | Freq: Four times a day (QID) | ORAL | 0 refills | Status: AC

## 2023-07-25 NOTE — ED Provider Notes (Signed)
[facility] EMERGENCY DEPARTMENT AT [facility] Provider Note   CSN: [id number] Arrival date & time: 07/24/23  2136     History  Chief Complaint  Patient presents with   Rash    [name] is a 42 y.o. female.  42 year old female here with her significant other both of whom have similar rashes.  They both state they have been there for about a week and moved in with her mother 10 days ago.  She has a history of having scabies.  She states that sometimes there is little black dot in these wounds and sometimes there is a little yellow "chunk".  She states no drainage.  No fevers.  Her boyfriend uses methamphetamines, patient denies but I suspect she does as well.   Rash      Home Medications Prior to Admission medications   Medication Sig Start Date End Date Taking? Authorizing Provider  hydrOXYzine (ATARAX) 25 MG tablet Take 1 tablet (25 mg total) by mouth every 6 (six) hours. 07/25/23  Yes Latarsha Zani, Barbara Cower, MD  permethrin (ELIMITE) 5 % cream Apply to affected area once 07/25/23  Yes Yaman Grauberger, Barbara Cower, MD  sulfamethoxazole-trimethoprim (BACTRIM DS) 800-160 MG tablet Take 1 tablet by mouth 2 (two) times daily for 7 days. 07/25/23 08/01/23 Yes Irem Stoneham, Barbara Cower, MD  ALPRAZolam (XANAX PO) Take 1 tablet by mouth once.    [provider]  ibuprofen (ADVIL,MOTRIN) 800 MG tablet Take 1 tablet (800 mg total) by mouth 3 (three) times daily. 07/17/17   Defrancesco, Prentice Docker, MD  oxyCODONE-acetaminophen (ROXICET) 5-325 MG tablet Take 1-2 tablets by mouth every 6 (six) hours as needed for severe pain. 07/18/17   Defrancesco, Prentice Docker, MD  polyethylene glycol (MIRALAX / GLYCOLAX) packet Take 17 g by mouth daily. 04/06/16   Jadee Golebiewski, Barbara Cower, MD  sodium phosphate (FLEET) 7-19 GM/118ML ENEM Place 133 mLs (1 enema total) rectally daily as needed for severe constipation. 04/06/16   Kvon Mcilhenny, Barbara Cower, MD  traMADol (ULTRAM) 50 MG tablet Take 1 tablet (50 mg total) by mouth every 6 (six) hours as  needed. 07/17/17   Defrancesco, Prentice Docker, MD      Allergies    Hydrocodone-acetaminophen and Vancomycin    Review of Systems   Review of Systems  Skin:  Positive for rash.    Physical Exam Updated Vital Signs BP 134/82 (BP Location: Left Arm)   Pulse 88   Temp 98.1 F (36.7 C)   Resp 18   Ht 5\' 2"  (1.575 m)   Wt 61.2 kg   LMP 07/18/2023   SpO2 100%   BMI 24.69 kg/m  Physical Exam Vitals and nursing note reviewed.  Constitutional:      Appearance: She is well-developed.  HENT:     Head: Normocephalic and atraumatic.  Cardiovascular:     Rate and Rhythm: Normal rate and regular rhythm.  Pulmonary:     Effort: No respiratory distress.     Breath sounds: No stridor.  Abdominal:     General: There is no distension.  Musculoskeletal:     Cervical back: Normal range of motion.  Skin:    Comments: Multiple areas of open wounds with surrounding erythema and excoriation marks all over her extremities and lower torso  Neurological:     Mental Status: She is alert.     ED Results / Procedures / Treatments   Labs (all labs ordered are listed, but only abnormal results are displayed) Labs Reviewed - No data to display  EKG  None  Radiology No results found.  Procedures Procedures    Medications Ordered in ED Medications  hydrOXYzine (ATARAX) tablet 25 mg (has no administration in time range)  sulfamethoxazole-trimethoprim (BACTRIM DS) 800-160 MG per tablet 1 tablet (has no administration in time range)    ED Course/ Medical Decision Making/ A&P                                 Medical Decision Making Risk Prescription drug management.   Suspect some type of picking secondary to drug use versus insect bites like scabies or bedbugs.  Especially since this started a few days after moving into a new house.  Consider also possible MRSA colonization.  Will treat with Bactrim and permethrin.  Follow-up with PCP in a week if not improving.  No evidence of diffuse  cellulitis, Stevens-Johnson or other emergent rash.   Final Clinical Impression(s) / ED Diagnoses Final diagnoses:  Rash    Rx / DC Orders ED Discharge Orders          Ordered    permethrin (ELIMITE) 5 % cream        07/25/23 0144    hydrOXYzine (ATARAX) 25 MG tablet  Every 6 hours        07/25/23 0144    sulfamethoxazole-trimethoprim (BACTRIM DS) 800-160 MG tablet  2 times daily        07/25/23 0144              Zariel Capano, Barbara Cower, MD 07/25/23 0151

## 2023-07-25 NOTE — Discharge Instructions (Signed)
Abscesses and cellulitis are often caused by bacteria that live on your skin naturally all the time.  One way to get rid of this is to put a cup of bleach in a bathtub of water soaking for 10 minutes a day for a week.  If you do this, realize the bleach will stain your clothes, towels, carpets, rugs and anything else with die.  It will also make your feet slippery when used about a tub to be careful not to fall.  Another thing that works very well is chlorhexidine washes.  You can buy chlorhexidine wipes at the store and cleanse your whole body with them once a day for 7 days along with putting mupirocin ointment in your nose and finding chlorhexidine mouthwash to rinse her mouth with twice a day.  

## 2024-07-03 ENCOUNTER — Encounter (HOSPITAL_COMMUNITY): Payer: Self-pay

## 2024-07-03 ENCOUNTER — Other Ambulatory Visit: Payer: Self-pay

## 2024-07-03 ENCOUNTER — Emergency Department (HOSPITAL_COMMUNITY)
Admission: EM | Admit: 2024-07-03 | Discharge: 2024-07-04 | Disposition: A | Discharge status: Home / Self Care | Attending: Emergency Medicine | Admitting: Emergency Medicine

## 2024-07-03 DIAGNOSIS — S299XXA Unspecified injury of thorax, initial encounter: Secondary | ICD-10-CM | POA: Diagnosis present

## 2024-07-03 DIAGNOSIS — D75839 Thrombocytosis, unspecified: Secondary | ICD-10-CM | POA: Insufficient documentation

## 2024-07-03 DIAGNOSIS — S00432A Contusion of left ear, initial encounter: Secondary | ICD-10-CM | POA: Diagnosis not present

## 2024-07-03 DIAGNOSIS — S2242XA Multiple fractures of ribs, left side, initial encounter for closed fracture: Secondary | ICD-10-CM | POA: Insufficient documentation

## 2024-07-03 DIAGNOSIS — R739 Hyperglycemia, unspecified: Secondary | ICD-10-CM

## 2024-07-03 DIAGNOSIS — R7309 Other abnormal glucose: Secondary | ICD-10-CM | POA: Insufficient documentation

## 2024-07-03 DIAGNOSIS — R220 Localized swelling, mass and lump, head: Secondary | ICD-10-CM | POA: Insufficient documentation

## 2024-07-03 DIAGNOSIS — D509 Iron deficiency anemia, unspecified: Secondary | ICD-10-CM | POA: Insufficient documentation
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
## 2024-07-04 ENCOUNTER — Telehealth (INDEPENDENT_AMBULATORY_CARE_PROVIDER_SITE_OTHER): Payer: Self-pay

## 2024-07-04 ENCOUNTER — Emergency Department (HOSPITAL_COMMUNITY)

## 2024-07-04 LAB — CBC WITH DIFFERENTIAL/PLATELET
Abs Immature Granulocytes: 0.02 K/uL (ref 0.00–0.07)
Basophils Absolute: 0 K/uL (ref 0.0–0.1)
Basophils Relative: 0 %
Eosinophils Absolute: 0.4 K/uL (ref 0.0–0.5)
Eosinophils Relative: 4 %
HCT: 30.7 % — ABNORMAL LOW (ref 36.0–46.0)
Hemoglobin: 8.9 g/dL — ABNORMAL LOW (ref 12.0–15.0)
Immature Granulocytes: 0 %
Lymphocytes Relative: 29 %
Lymphs Abs: 2.6 K/uL (ref 0.7–4.0)
MCH: 21.5 pg — ABNORMAL LOW (ref 26.0–34.0)
MCHC: 29 g/dL — ABNORMAL LOW (ref 30.0–36.0)
MCV: 74.2 fL — ABNORMAL LOW (ref 80.0–100.0)
Monocytes Absolute: 0.6 K/uL (ref 0.1–1.0)
Monocytes Relative: 6 %
Neutro Abs: 5.5 K/uL (ref 1.7–7.7)
Neutrophils Relative %: 61 %
Platelets: 492 K/uL — ABNORMAL HIGH (ref 150–400)
RBC: 4.14 MIL/uL (ref 3.87–5.11)
RDW: 18.2 % — ABNORMAL HIGH (ref 11.5–15.5)
WBC: 9 K/uL (ref 4.0–10.5)
nRBC: 0 % (ref 0.0–0.2)

## 2024-07-04 LAB — COMPREHENSIVE METABOLIC PANEL WITH GFR
ALT: <5 U/L (ref 0–44)
AST: 17 U/L (ref 15–41)
Albumin: 3.7 g/dL (ref 3.5–5.0)
Alkaline Phosphatase: 76 U/L (ref 38–126)
Anion gap: 13 (ref 5–15)
BUN: 8 mg/dL (ref 6–20)
CO2: 24 mmol/L (ref 22–32)
Calcium: 9.1 mg/dL (ref 8.9–10.3)
Chloride: 103 mmol/L (ref 98–111)
Creatinine, Ser: 0.57 mg/dL (ref 0.44–1.00)
GFR, Estimated: >60 mL/min (ref >60)
Glucose, Bld: 113 mg/dL — ABNORMAL HIGH (ref 70–99)
Potassium: 3.5 mmol/L (ref 3.5–5.1)
Sodium: 140 mmol/L (ref 135–145)
Total Bilirubin: 0.3 mg/dL (ref 0.0–1.2)
Total Protein: 6.7 g/dL (ref 6.5–8.1)

## 2024-07-04 MED ORDER — OXYCODONE HCL 5 MG PO TABS: 5.0000 mg | ORAL_TABLET | ORAL | 0 refills | Status: AC | PRN

## 2024-07-04 MED ORDER — OXYCODONE-ACETAMINOPHEN 5-325 MG PO TABS
1.0000 | ORAL_TABLET | Freq: Once | ORAL | Status: AC
  Administered 2024-07-04: 1 via ORAL
  Filled 2024-07-04: qty 1

## 2024-07-04 MED ORDER — IOHEXOL 300 MG/ML  SOLN
100.0000 mL | Freq: Once | INTRAMUSCULAR | Status: AC | PRN
  Administered 2024-07-04: 100 mL via INTRAVENOUS

## 2024-07-04 MED ORDER — LIDOCAINE-EPINEPHRINE (PF) 2 %-1:200000 IJ SOLN
20.0000 mL | Freq: Once | INTRAMUSCULAR | Status: AC
  Administered 2024-07-04: 20 mL
  Filled 2024-07-04: qty 20

## 2024-07-04 NOTE — Discharge Instructions (Addendum)
 It is very important that you see the ENT doctor.  He states he can either see you this afternoon or tomorrow morning.  Call the office to let them know what time you are coming.  If the swelling in the ear is not evacuated, it can destroy the cartilage in your ear and make it permanently disfigured.  Apply ice to sore areas.  Ice should be applied for 30 minutes at a time, 4 times a day.  You may take acetaminophen  and/or ibuprofen  as needed for pain.  When you combine acetaminophen  and ibuprofen , you get better pain relief than you get from taking either medication by itself.  For pain that is not controlled by the combination of acetaminophen  and ibuprofen , add oxycodone .

## 2024-07-04 NOTE — Telephone Encounter (Signed)
 Called patient to set up appointment with Dr.Knight for ear drainage on 9/19 at 12:30 pm.No answer, vm not set up.

## 2024-07-04 NOTE — ED Notes (Signed)
 Discharge instructions, medications, and follow up care reviewed with and provided to pt. Pt denies any further questions, and has verbalized understanding.

## 2024-07-04 NOTE — Progress Notes (Signed)
 Spoke to Dr. Raford over the phone. Recommended patient to come into office today or tomorrow. ENT office number and address given to patient. My office called the patient to schedule this afternoon, which was unsuccessful.

## 2024-07-04 NOTE — ED Notes (Signed)
 Suture cart setup at bedside and lidocaine  given to Dr. Raford.
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
